# Patient Record
Sex: Male | Born: 1965 | Race: White | Hispanic: No | Smoking: Former smoker
Health system: Southern US, Community
[De-identification: ages and names within clinical notes are randomized; demographics above are authoritative.]

## PROBLEM LIST (undated history)

## (undated) DIAGNOSIS — K0889 Other specified disorders of teeth and supporting structures: Secondary | ICD-10-CM

## (undated) DIAGNOSIS — I429 Cardiomyopathy, unspecified: Secondary | ICD-10-CM

## (undated) HISTORY — PX: OTHER SURGICAL HISTORY: SHX169

## (undated) HISTORY — DX: Cardiomyopathy, unspecified: I42.9

---

## 2003-08-02 ENCOUNTER — Emergency Department (HOSPITAL_COMMUNITY): Admission: EM | Admit: 2003-08-02 | Discharge: 2003-08-02 | Payer: Self-pay | Admitting: Emergency Medicine

## 2015-02-06 ENCOUNTER — Emergency Department (HOSPITAL_COMMUNITY)
Admission: EM | Admit: 2015-02-06 | Discharge: 2015-02-06 | Disposition: A | Discharge status: Home / Self Care | Payer: Commercial Managed Care - HMO | Attending: Emergency Medicine | Admitting: Emergency Medicine

## 2015-02-06 ENCOUNTER — Encounter (HOSPITAL_COMMUNITY): Payer: Self-pay | Admitting: Emergency Medicine

## 2015-02-06 DIAGNOSIS — Z720 Tobacco use: Secondary | ICD-10-CM | POA: Diagnosis not present

## 2015-02-06 DIAGNOSIS — K047 Periapical abscess without sinus: Secondary | ICD-10-CM

## 2015-02-06 DIAGNOSIS — K029 Dental caries, unspecified: Secondary | ICD-10-CM | POA: Insufficient documentation

## 2015-02-06 DIAGNOSIS — K088 Other specified disorders of teeth and supporting structures: Secondary | ICD-10-CM | POA: Diagnosis present

## 2015-02-06 MED ORDER — TRAMADOL HCL 50 MG PO TABS: 50.0000 mg | ORAL_TABLET | Freq: Four times a day (QID) | ORAL | Status: DC | PRN

## 2015-02-06 MED ORDER — PENICILLIN V POTASSIUM 500 MG PO TABS: 500.0000 mg | ORAL_TABLET | Freq: Three times a day (TID) | ORAL | Status: DC

## 2015-02-06 NOTE — ED Provider Notes (Signed)
CSN: [id number]     Arrival date & time 02/06/15  1130 History  This chart was scribed for non-physician practitioner, Arthor Captain, PA-C, working with Linwood Dibbles, MD by Charline Bills, ED Scribe. This patient was seen in room TR07C/TR07C and the patient's care was started at 11:43 AM.   Chief Complaint  Patient presents with  . Dental Pain   The history is provided by the patient. No language interpreter was used.   HPI Comments: Aaron Avery is a 49 y.o. male who presents to the Emergency Department complaining of intermittent right dental pain for the past month. Pt suspects formation of a dental abscess to the area. He reports that dental pain radiates into his right jaw and is exacerbated with palpation. Pt has tried 8 Aleve tablets and 5 BC Powders yesterday without significant relief. He denies fever and difficulty swallowing. Pt is a smoker. No known medical allergies.   History reviewed. No pertinent past medical history. History reviewed. No pertinent past surgical history. No family history on file. Social History  Substance Use Topics  . Smoking status: Current Every Day Smoker -- 1.00 packs/day    Types: Cigarettes  . Smokeless tobacco: None  . Alcohol Use: No    Review of Systems  Constitutional: Negative for fever.  HENT: Positive for dental problem. Negative for trouble swallowing.    Allergies  Review of patient's allergies indicates no known allergies.  Home Medications   Prior to Admission medications   Not on File   BP 133/83 mmHg  Pulse 107  Temp(Src) 98.5 F (36.9 C)  Resp 20  SpO2 100% Physical Exam  Constitutional: He is oriented to person, place, and time. He appears well-developed and well-nourished. No distress.  HENT:  Head: Normocephalic and atraumatic.  Multiple dental carries. Fluctuance in R upper gum and an area that expresses some purulence.   Eyes: Conjunctivae and EOM are normal.  Neck: Neck supple. No tracheal deviation present.   Cardiovascular: Normal rate.   Pulmonary/Chest: Effort normal. No respiratory distress.  Musculoskeletal: Normal range of motion.  Neurological: He is alert and oriented to person, place, and time.  Skin: Skin is warm and dry.  Psychiatric: He has a normal mood and affect. His behavior is normal.  Nursing note and vitals reviewed.  ED Course  Procedures (including critical care time) DIAGNOSTIC STUDIES: Oxygen Saturation is 100% on RA, normal by my interpretation.    COORDINATION OF CARE: 11:47 AM-Discussed treatment plan which includes Veetid, Tramadol and follow-up with dentist with pt at bedside and pt agreed to plan.   Labs Review Labs Reviewed - No data to display  Imaging Review No results found. I, Arthor Captain, PA-C, have personally reviewed and evaluated these images and lab results as part of my medical decision-making.   EKG Interpretation None      MDM   Final diagnoses:  Dental infection    Patient with toothache and draining periapical abscess.  Exam unconcerning for Ludwig's angina or spread of infection.  Will treat with penicillin and pain medicine.  Urged patient to follow-up with dentist.     I personally performed the services described in this documentation, which was scribed in my presence. The recorded information has been reviewed and is accurate.       Arthor Captain, PA-C 02/06/15 1705  Linwood Dibbles, MD 02/07/15 [phone]

## 2015-02-06 NOTE — Discharge Instructions (Signed)
Dental Abscess A dental abscess is a collection of infected fluid (pus) from a bacterial infection in the inner part of the tooth (pulp). It usually occurs at the end of the tooth's root.  CAUSES   Severe tooth decay.  Trauma to the tooth that allows bacteria to enter into the pulp, such as a broken or chipped tooth. SYMPTOMS   Severe pain in and around the infected tooth.  Swelling and redness around the abscessed tooth or in the mouth or face.  Tenderness.  Pus drainage.  Bad breath.  Bitter taste in the mouth.  Difficulty swallowing.  Difficulty opening the mouth.  Nausea.  Vomiting.  Chills.  Swollen neck glands. DIAGNOSIS   A medical and dental history will be taken.  An examination will be performed by tapping on the abscessed tooth.  X-rays may be taken of the tooth to identify the abscess. TREATMENT The goal of treatment is to eliminate the infection. You may be prescribed antibiotic medicine to stop the infection from spreading. A root canal may be performed to save the tooth. If the tooth cannot be saved, it may be pulled (extracted) and the abscess may be drained.  HOME CARE INSTRUCTIONS  Only take over-the-counter or prescription medicines for pain, fever, or discomfort as directed by your caregiver.  Rinse your mouth (gargle) often with salt water ( tsp salt in 8 oz [250 ml] of warm water) to relieve pain or swelling.  Do not drive after taking pain medicine (narcotics).  Do not apply heat to the outside of your face.  Return to your dentist for further treatment as directed. SEEK MEDICAL CARE IF:  Your pain is not helped by medicine.  Your pain is getting worse instead of better. SEEK IMMEDIATE MEDICAL CARE IF:  You have a fever or persistent symptoms for more than 2-3 days.  You have a fever and your symptoms suddenly get worse.  You have chills or a very bad headache.  You have problems breathing or swallowing.  You have trouble  opening your mouth.  You have swelling in the neck or around the eye. Document Released: 06/09/2005 Document Revised: 03/03/2012 Document Reviewed: 09/17/2010 [facility] Patient Information 2015 [place], [place]. This information is not intended to replace advice given to you by your health care provider. Make sure you discuss any questions you have with your health care provider.   [facility] [address] [place], [place] [address] Phone [phone]  [facility] [employer] [facility] [place] exemplifies the [employer] vision to improve the health and quality of life of all [place]ians by Public house manager with a passion to care for the underserved and by leading the nation in community-based, service learning oral health education.  We are committed to offering comprehensive general dental services for adults, children and special needs patients in a safe, caring and professional setting.   Appointments: Our clinic is open Monday through Friday 8:00 a.m. until 5:00 p.m. The amount of time scheduled for an appointment depends on the patients specific needs. We ask that you keep your appointed time for care or provide 24-hour notice of all appointment changes. Parents or legal guardians must accompany minor children.   Payment for Services: Medicaid and other insurance plans are welcome. Payment for services is due when services are rendered and may be made by cash or credit card. If you have dental  insurance, we will assist you with your claim submission.    Emergencies:  Emergency services will be provided Monday through Friday on a walk-in basis.  Please arrive early for emergency services. After hours emergency services will be provided for patients of record as required.   Services:  Conservation officer, historic buildings Dentistry Oral Surgery - Extractions Root Canals Sealants and Tooth Colored Fillings Crowns and Bridges Dentures and Partial Dentures Implant Services Periodontal Services and Cleanings Cosmetic Armed forces operational officer [employer]

## 2015-02-06 NOTE — ED Notes (Signed)
Pt reports chronic dental issues but feels like he has an abcess in the top right of his mouth. Pt has taken multiple b/c powders and aleve yesterday with no relief.

## 2015-02-28 ENCOUNTER — Encounter (HOSPITAL_COMMUNITY): Payer: Self-pay | Admitting: *Deleted

## 2015-02-28 ENCOUNTER — Emergency Department (HOSPITAL_COMMUNITY)
Admission: EM | Admit: 2015-02-28 | Discharge: 2015-02-28 | Disposition: A | Discharge status: Home / Self Care | Payer: Commercial Managed Care - HMO | Attending: Emergency Medicine | Admitting: Emergency Medicine

## 2015-02-28 DIAGNOSIS — K047 Periapical abscess without sinus: Secondary | ICD-10-CM

## 2015-02-28 DIAGNOSIS — Z720 Tobacco use: Secondary | ICD-10-CM | POA: Insufficient documentation

## 2015-02-28 DIAGNOSIS — K088 Other specified disorders of teeth and supporting structures: Secondary | ICD-10-CM | POA: Diagnosis present

## 2015-02-28 DIAGNOSIS — Z792 Long term (current) use of antibiotics: Secondary | ICD-10-CM | POA: Insufficient documentation

## 2015-02-28 MED ORDER — NAPROXEN 500 MG PO TABS: 500.0000 mg | ORAL_TABLET | Freq: Two times a day (BID) | ORAL | Status: DC

## 2015-02-28 MED ORDER — AMOXICILLIN 500 MG PO CAPS: 500.0000 mg | ORAL_CAPSULE | Freq: Three times a day (TID) | ORAL | Status: DC

## 2015-02-28 NOTE — Discharge Instructions (Signed)
Follow up with a dentist as soon as possible.  Dental Abscess A dental abscess is a collection of infected fluid (pus) from a bacterial infection in the inner part of the tooth (pulp). It usually occurs at the end of the tooth's root.  CAUSES   Severe tooth decay.  Trauma to the tooth that allows bacteria to enter into the pulp, such as a broken or chipped tooth. SYMPTOMS   Severe pain in and around the infected tooth.  Swelling and redness around the abscessed tooth or in the mouth or face.  Tenderness.  Pus drainage.  Bad breath.  Bitter taste in the mouth.  Difficulty swallowing.  Difficulty opening the mouth.  Nausea.  Vomiting.  Chills.  Swollen neck glands. DIAGNOSIS   A medical and dental history will be taken.  An examination will be performed by tapping on the abscessed tooth.  X-rays may be taken of the tooth to identify the abscess. TREATMENT The goal of treatment is to eliminate the infection. You may be prescribed antibiotic medicine to stop the infection from spreading. A root canal may be performed to save the tooth. If the tooth cannot be saved, it may be pulled (extracted) and the abscess may be drained.  HOME CARE INSTRUCTIONS  Only take over-the-counter or prescription medicines for pain, fever, or discomfort as directed by your caregiver.  Rinse your mouth (gargle) often with salt water ( tsp salt in 8 oz [250 ml] of warm water) to relieve pain or swelling.  Do not drive after taking pain medicine (narcotics).  Do not apply heat to the outside of your face.  Return to your dentist for further treatment as directed. SEEK MEDICAL CARE IF:  Your pain is not helped by medicine.  Your pain is getting worse instead of better. SEEK IMMEDIATE MEDICAL CARE IF:  You have a fever or persistent symptoms for more than 2-3 days.  You have a fever and your symptoms suddenly get worse.  You have chills or a very bad headache.  You have problems  breathing or swallowing.  You have trouble opening your mouth.  You have swelling in the neck or around the eye. Document Released: 06/09/2005 Document Revised: 03/03/2012 Document Reviewed: 09/17/2010 [facility] Patient Information 2015 [place], [place]. This information is not intended to replace advice given to you by your health care provider. Make sure you discuss any questions you have with your health care provider.

## 2015-02-28 NOTE — ED Notes (Signed)
See Kerrie Buffalo NP note for secondary assessment.

## 2015-02-28 NOTE — ED Provider Notes (Signed)
CSN: [id number]     Arrival date & time 02/28/15  1420 History  This chart was scribed for Aaron Buffalo, NP working with Gerhard Munch, MD by Evon Slack, ED Scribe. This patient was seen in room TR06C/TR06C and the patient's care was started at 2:56 PM.     Chief Complaint  Patient presents with  . Dental Pain   Patient is a 49 y.o. male presenting with tooth pain.  Dental Pain Location:  Upper Severity:  Mild Onset quality:  Gradual Duration:  1 month Chronicity:  Recurrent Context: abscess   Associated symptoms: no difficulty swallowing and no fever    HPI Comments: Aaron Avery is a 49 y.o. male who presents to the Emergency Department complaining of recurrent right sided dental pain onset 1 month prior. Pt states that he he was seen in the ED last month for the same dental pain. Pt states that he has a dental abscess that has began draining. Pt states that he tried to pop the abscess this morning. Pt states that he was complaint with the antibiotics prescribed previously but did not follow up with a dentist. He doesn't report fever or trouble swallowing.   History reviewed. No pertinent past medical history. History reviewed. No pertinent past surgical history. No family history on file. Social History  Substance Use Topics  . Smoking status: Current Every Day Smoker -- 1.00 packs/day    Types: Cigarettes  . Smokeless tobacco: None  . Alcohol Use: No    Review of Systems  Constitutional: Negative for fever.  HENT: Positive for dental problem. Negative for trouble swallowing.   All other systems reviewed and are negative.     Allergies  Review of patient's allergies indicates no known allergies.  Home Medications   Prior to Admission medications   Medication Sig Start Date End Date Taking? Authorizing Provider  amoxicillin (AMOXIL) 500 MG capsule Take 1 capsule (500 mg total) by mouth 3 (three) times daily. 02/28/15   Hope Orlene Och, NP  naproxen (NAPROSYN) 500  MG tablet Take 1 tablet (500 mg total) by mouth 2 (two) times daily. 02/28/15   Hope Orlene Och, NP  penicillin v potassium (VEETID) 500 MG tablet Take 1 tablet (500 mg total) by mouth 3 (three) times daily. 02/06/15   Arthor Captain, PA-C  traMADol (ULTRAM) 50 MG tablet Take 1 tablet (50 mg total) by mouth every 6 (six) hours as needed. 02/06/15   Abigail Harris, PA-C   BP 117/73 mmHg  Pulse 70  Temp(Src) 98.8 F (37.1 C) (Oral)  Resp 18  Ht  (1.676 m)  Wt 155 lb (70.308 kg)  BMI 25.03 kg/m2  SpO2 98%   Physical Exam  Constitutional: He is oriented to person, place, and time. He appears well-developed and well-nourished. No distress.  HENT:  Head: Normocephalic and atraumatic.  Multiple dental caries, abscess above the right lateral incisor that's draining purulent drainage.   Eyes: Conjunctivae and EOM are normal.  Neck: Normal range of motion. Neck supple. No tracheal deviation present.  Cardiovascular: Normal rate.   Pulmonary/Chest: Effort normal. No respiratory distress.  Musculoskeletal: Normal range of motion.  Lymphadenopathy:    He has cervical adenopathy.  Neurological: He is alert and oriented to person, place, and time.  Skin: Skin is warm and dry.  Psychiatric: He has a normal mood and affect. His behavior is normal.  Nursing note and vitals reviewed.   ED Course  Procedures (including critical care time) DIAGNOSTIC STUDIES: Oxygen  Saturation is 98% on RA, normal by my interpretation.    COORDINATION OF CARE: 2:58 PM-Discussed treatment plan with pt at bedside and pt agreed to plan.      MDM  49 y.o. male with dental pain due to abscess. Stable for d/c without fever or difficulty swallowing. I discussed with the patient that this time he will need to follow up with the dentist as he was instructed at the last visit. Patient agrees to follow up. Will treat for infection and pain.   Final diagnoses:  Dental abscess   I personally performed the services  described in this documentation, which was scribed in my presence. The recorded information has been reviewed and is accurate.      [place], NP 02/28/15 2222  Tilden Fossa, MD 03/01/15 [phone]

## 2015-02-28 NOTE — ED Notes (Signed)
Pt reports continued pain to rt upper tooth. Pt was seen on the 16th. Pt was unable to follow up with the dentist. Pt completed antibiotics.

## 2015-03-30 ENCOUNTER — Encounter (HOSPITAL_COMMUNITY): Payer: Self-pay | Admitting: Emergency Medicine

## 2015-03-30 ENCOUNTER — Emergency Department (HOSPITAL_COMMUNITY)
Admission: EM | Admit: 2015-03-30 | Discharge: 2015-03-30 | Disposition: A | Discharge status: Home / Self Care | Payer: Commercial Managed Care - HMO | Attending: Emergency Medicine | Admitting: Emergency Medicine

## 2015-03-30 DIAGNOSIS — K0889 Other specified disorders of teeth and supporting structures: Secondary | ICD-10-CM | POA: Insufficient documentation

## 2015-03-30 DIAGNOSIS — Z792 Long term (current) use of antibiotics: Secondary | ICD-10-CM | POA: Insufficient documentation

## 2015-03-30 DIAGNOSIS — Z720 Tobacco use: Secondary | ICD-10-CM | POA: Diagnosis not present

## 2015-03-30 DIAGNOSIS — K029 Dental caries, unspecified: Secondary | ICD-10-CM | POA: Diagnosis not present

## 2015-03-30 MED ORDER — TRAMADOL HCL 50 MG PO TABS: 50.0000 mg | ORAL_TABLET | Freq: Four times a day (QID) | ORAL | Status: DC | PRN

## 2015-03-30 MED ORDER — CLINDAMYCIN HCL 150 MG PO CAPS
450.0000 mg | ORAL_CAPSULE | Freq: Once | ORAL | Status: AC
  Administered 2015-03-30: 450 mg via ORAL
  Filled 2015-03-30: qty 3

## 2015-03-30 MED ORDER — NAPROXEN 250 MG PO TABS
500.0000 mg | ORAL_TABLET | Freq: Once | ORAL | Status: AC
  Administered 2015-03-30: 500 mg via ORAL
  Filled 2015-03-30: qty 2

## 2015-03-30 MED ORDER — NAPROXEN 500 MG PO TABS: 500.0000 mg | ORAL_TABLET | Freq: Two times a day (BID) | ORAL | Status: DC

## 2015-03-30 MED ORDER — CLINDAMYCIN HCL 150 MG PO CAPS: 450.0000 mg | ORAL_CAPSULE | Freq: Three times a day (TID) | ORAL | Status: AC

## 2015-03-30 NOTE — Discharge Instructions (Signed)
Dental Caries Dental caries is tooth decay. This decay can cause a hole in teeth (cavity) that can get bigger and deeper over time. HOME CARE  Brush and floss your teeth. Do this at least two times a day.  Use a fluoride toothpaste.  Use a mouth rinse if told by your dentist or doctor.  Eat less sugary and starchy foods. Drink less sugary drinks.  Avoid snacking often on sugary and starchy foods. Avoid sipping often on sugary drinks.  Keep regular checkups and cleanings with your dentist.  Use fluoride supplements if told by your dentist or doctor.  Allow fluoride to be applied to teeth if told by your dentist or doctor.   This information is not intended to replace advice given to you by your health care provider. Make sure you discuss any questions you have with your health care provider.   Document Released: 03/18/2008 Document Revised: 06/30/2014 Document Reviewed: 06/11/2012 Elsevier Interactive Patient Education 2016 [employer].    Emergency Department Resource Guide 1) Find a Doctor and Pay Out of Pocket Although you won't have to find out who is covered by your insurance plan, it is a good idea to ask around and get recommendations. You will then need to call the office and see if the doctor you have chosen will accept you as a new patient and what types of options they offer for patients who are self-pay. Some doctors offer discounts or will set up payment plans for their patients who do not have insurance, but you will need to ask so you aren't surprised when you get to your appointment.  2) Contact Your Local Health Department Not all health departments have doctors that can see patients for sick visits, but many do, so it is worth a call to see if yours does. If you don't know where your local health department is, you can check in your phone book. The CDC also has a tool to help you locate your state's health department, and many state websites also have listings of  all of their local health departments.  3) Find a Walk-in Clinic If your illness is not likely to be very severe or complicated, you may want to try a walk in clinic. These are popping up all over the country in pharmacies, drugstores, and shopping centers. They're usually staffed by nurse practitioners or physician assistants that have been trained to treat common illnesses and complaints. They're usually fairly quick and inexpensive. However, if you have serious medical issues or chronic medical problems, these are probably not your best option.  No Primary Care Doctor: - Call Health Connect at  [phone] - they can help you locate a primary care doctor that  accepts your insurance, provides certain services, etc. - Physician Referral Service- [phone]  Chronic Pain Problems: Organization         Address  Phone   Notes  [facility]  [phone] Patients need to be referred by their primary care doctor.   Medication Assistance: Organization         Address  Phone   Notes  [facility] [address] [place] [place], [place] [address] [phone] --Must be a resident of [facility] -- Must have NO insurance coverage whatsoever (no Medicaid/ Medicare, etc.) -- The pt. MUST have a primary care doctor that directs their care regularly and follows them in the community   [facility]ist  [phone]   [address]  (  Agencies that provide inexpensive medical care: °Organization         Address  Phone   Notes  °[facility]  ([phone]) [phone]   °[facility]    ([phone]) [phone]   °[facility] [address] °[place], [place] [address] ([phone]) [phone]   °[facility] [address], °[place] ([phone]) [phone]   °[facility]    (336) 373-0678   °[facility]    (336) 272-1050   °[facility] ° [address],  [place] Phone:  (336) [phone], Fax:  ([phone]) [phone] Hours of Operation:  9 am - 6 pm, M-F.  Also accepts Medicaid/Medicare and self-pay.  °[facility] ° [address], [place] Phone: (336) [phone], Fax: ([phone]) [phone]. Hours of Operation:  8:30 am - 5:30 pm, M-F.  Also accepts Medicaid and self-pay.  °[facility] [place] [address], [place] Phone: ([phone]) [phone]   °[facility] [address], [place], [place] (336)[phone], Ext. 123 Mondays & Thursdays: 7-9 AM.  First 15 patients are seen on a first come, first serve basis. °  ° °Medicaid-accepting [place] Providers: ° °Organization         Address  Phone   Notes  °[facility] [address], [place] ([phone]) [phone] Also accepts self-pay patients.  °[facility] [address], [place] ° (336) [phone]   °[facility] [address], [place] ([phone]) [phone]   °[facility] [address] [place] [address], [place] ([phone]) [phone]   °Veita Bland [address], [place]  ° ([phone]) [phone] Only accepts [place] Access Medicaid patients after they have their name applied to their card.  ° °Self-Pay (no insurance) in [place]: ° °Organization         Address  Phone   Notes  °Sickle Cell Patients, [facility] [address], [place] ([phone]) [phone]   °[facility] [address], [place] ([phone]) [phone]   ° [facility] ° [address], [place] (336) [phone]   °[facility]/Dr. Osei-Bonsu ° [address] [place] [address], [place] or [address], [place] ([phone]) [phone] Phone number for both [place] and [place] locations is the same.  °[facility] [address], [place] ([phone]) [phone]   °[facility] [address] [place] [address], [place] or [address] ([phone]) [phone] °(336) [phone]   °[facility] [address], [place] (336) 350-1642, phone; ([phone]) [phone], fax Sees patients 1st and 3rd Saturday of every month.  Must not qualify for public or private insurance (i.e. Medicaid, Medicare, [place] Health Choice, Veterans' Benefits) • Household income should be no more than 200% of the poverty level •The clinic cannot treat you if you are pregnant or think you are pregnant • Sexually transmitted diseases are not treated at the clinic.  ° ° °Dental Care: °Organization         Address  Phone  Notes  °[place] [facility] [address], [place] (336) [phone] Accepts children up to age 21 who are enrolled in Medicaid or [place] Health Choice; pregnant women with a Medicaid card; and children who have applied for Medicaid or   have applied for Medicaid or [place] Health Choice, but were declined, whose parents can pay a reduced fee at time of service.  [facility]  [address], [place] [phone] Accepts children up to age 57 who are enrolled in [place] or [place] Health Choice; pregnant women with a Medicaid card; and children who have applied for Medicaid or [place] Health Choice, but were declined, whose parents can pay a reduced fee at time of service.  [facility]  [address] [place], [place] [phone] Patients are seen by appointment only. Walk-ins are not accepted. [facility] will see patients 54 years of age and older. Monday - Tuesday (8am-5pm) Most Wednesdays (8:30-5pm) $30 per visit, cash only  [facility]  [address], [facility] [phone] Patients are seen by appointment only. Walk-ins are not accepted. [facility] will see patients 58 years of age and older. One Wednesday Evening (Monthly: Volunteer Based).  $30 per visit, cash only  [employer]   [phone] for adults; Children under age 72, call Graduate Pediatric Dentistry at [phone]. Children aged 19-14, please call [phone] to request a pediatric application.  Dental services are provided in all areas of dental care including fillings, crowns and bridges, complete and partial dentures, implants, gum treatment, root canals, and extractions. Preventive care is also provided. Treatment is provided to both adults and children. Patients are selected via a lottery and there is often a waiting list.   [facility] [address], [place]  [phone] [email or web address]   [facility] [address] [place], [place] [phone]) [phone], Ext. [phone] Second and Fourth Thursday of each month, opens at 6:30 AM; Clinic ends at 9 AM.  Patients are seen on a first-come first-served basis, and a limited number are seen during each clinic.   [facility]  [address] [place], [place] [phone]   Eligibility Requirements You must have lived in [place], [place], or [place] counties for at least the last three months.   You cannot be eligible for state or federal sponsored [place], including [employer], [place], or [employer].   You generally cannot be eligible for healthcare insurance through your employer.    How to apply: Eligibility screenings are held every Tuesday and Wednesday afternoon from 1:00 pm until 4:00 pm. You do not need an appointment for the interview!  [facility] [address] [place], [place] [phone]   [facility] Health Department  [phone]   [facility] Health Department  [phone]   [facility] Health Department  [phone]    Behavioral Health Resources in the Community: Intensive Outpatient Programs Organization         Address  Phone  Notes  [facility] [address] [place], [place] [phone]   [facility] [address], [place], [place] [phone]   [facility] [address], [place], [place]  [phone]   [facility] [address],  [place], [place] [phone] or 8640714858   Substance Abuse Resources Organization         Address  Phone  Notes  [facility]  [phone]   [facility]  [phone]   [facility]  [phone]   [facility]  9178133752   Residential & Outpatient  Substance Abuse Program  [phone]   Psychological Services Organization         Address  Phone  Notes  [facility] Health  [phone]   [facility] Services  [phone]   [facility] Mental Health [phone] [address], [place] [phone] or [phone]    Mobile Crisis Teams Organization         Address  Phone  Notes  [facility], Mobile Crisis Care Unit  [phone]   [facility]  [address] [place], [place] [phone]   Doristine Locks [address] [place] [phone]    Self-Help/Support Groups Organization         Address  Phone             Notes  [facility] [place] - variety of support groups  336- I7437963 Call for more information  Narcotics Anonymous (NA), [facility] [address], [employer] [place]  2 meetings at this location   Statistician         Address  Phone  Notes  [facility] [address],    [place]  [phone]   [facility]  [address], [place] [address], [place], [place] [phone]   [facility] [address] [place], [place] [phone] Admissions: 8am-3pm M-F  [facility] [address],    [place], [place] [phone]   [facility] [address] [place], [place], [place] [phone]   [facility] [address],  [place], [place] [phone]     [facility] - Intensive Outpatient [address], [place], [place] [phone]   [facility] ([facility] [address] [place]  [place], [place] [phone] or [phone]   [facility] ([facility]) [address], [place], [place] [phone] Accepts Medicaid  [facility] [address],  [place] [phone] Substance Abuse/Addiction Treatment   [facility] Organization         Address  Phone  Notes  [facility]  [phone]   Angie Fava, PhD [address] [place], [place]   3183334004 or ([phone]   [facility]   [address] [place], [place] [phone]   [facility] [address] [place], [place] [phone] Insurance/Medicaid/sponsorship through [facility] Families [address]                                    [place], [place] [phone] Therapy/tele-psych/case  [facility] [address], [place] [phone]    Dr. Lolly Mustache  [phone]   [facility]  [employer] [facility] Dept. 1) [address] [place] 2) [address] [place] 3)  [address], [place] [phone]  [phone]   [facility] Child Abuse Hotline 860-202-4044 or 6803880810 (After Hours)

## 2015-03-30 NOTE — ED Notes (Signed)
Pt here today for right upper dental pain. States he was here a few weeks ago for same. Took abx but 'it came back immediately'.  States he was unable to get into a dentist.

## 2015-03-30 NOTE — ED Provider Notes (Signed)
CSN: [id number]     Arrival date & time 03/30/15  1010 History  By signing my name below, I, Aaron Avery, attest that this documentation has been prepared under the direction and in the presence of Aaron Berry, PA-C. Electronically Signed: Elon Avery ED Scribe. 03/30/2015. 11:09 AM.    Chief Complaint  Patient presents with  . Dental Pain   The history is provided by the patient. No language interpreter was used.   HPI Comments: Aaron Avery is a 49 y.o. male who presents to the Emergency Department complaining of dental pain onset two months ago; partially relieved by [facility] powder, Aleve, and Melatonin (Last medication: 3:00 am).  His pain is located upper right posterior molars.  He has a history of occasional drainage from the gumline above his tooth.  He claims he can express it by pushing on the roof of his mouth or near his tooth.  He has been seen in the ED twice before and prescribed amoxicillin with compliance and brief improvement, however, he did not comply with dental f/u.  He denies fever, chills, sweats, N, V, difficulty breathing or swallowing.  He does not have any neck pain, sore throat, ear ache or head ache.   History reviewed. No pertinent past medical history. History reviewed. No pertinent past surgical history. No family history on file. Social History  Substance Use Topics  . Smoking status: Current Every Day Smoker -- 1.00 packs/day    Types: Cigarettes  . Smokeless tobacco: None  . Alcohol Use: No    Review of Systems  Constitutional: Negative.  Negative for fever, chills, diaphoresis and fatigue.  HENT: Negative for congestion, drooling, ear pain, facial swelling, mouth sores, sore throat, tinnitus, trouble swallowing and voice change.   Eyes: Negative.   Respiratory: Negative.  Negative for choking, shortness of breath and stridor.   Cardiovascular: Negative.   Gastrointestinal: Negative.   Genitourinary: Negative.   Musculoskeletal: Negative.    Neurological: Negative.   Psychiatric/Behavioral: Negative.       Allergies  Review of patient's allergies indicates no known allergies.  Home Medications   Prior to Admission medications   Medication Sig Start Date End Date Taking? Authorizing Provider  amoxicillin (AMOXIL) 500 MG capsule Take 1 capsule (500 mg total) by mouth 3 (three) times daily. 02/28/15   Hope Orlene Och, NP  naproxen (NAPROSYN) 500 MG tablet Take 1 tablet (500 mg total) by mouth 2 (two) times daily. 02/28/15   Hope Orlene Och, NP  penicillin v potassium (VEETID) 500 MG tablet Take 1 tablet (500 mg total) by mouth 3 (three) times daily. 02/06/15   Arthor Captain, PA-C  traMADol (ULTRAM) 50 MG tablet Take 1 tablet (50 mg total) by mouth every 6 (six) hours as needed. 02/06/15   Abigail Harris, PA-C   BP 135/79 mmHg  Pulse 84  Temp(Src) 98.4 F (36.9 C) (Oral)  Resp 18  Ht 5\' 7"  (1.702 m)  Wt 165 lb (74.844 kg)  BMI 25.84 kg/m2  SpO2 100% Physical Exam  Constitutional: He is oriented to person, place, and time. Vital signs are normal. He appears well-developed and well-nourished. He is cooperative.  Non-toxic appearance. No distress.  HENT:  Head: Normocephalic and atraumatic. Head is without right periorbital erythema and without left periorbital erythema.  Right Ear: Hearing and external ear normal.  Left Ear: Hearing and external ear normal.  Nose: Nose normal. No mucosal edema, rhinorrhea or sinus tenderness. Right sinus exhibits no maxillary sinus tenderness and no  frontal sinus tenderness. Left sinus exhibits no maxillary sinus tenderness and no frontal sinus tenderness.  Mouth/Throat: Uvula is midline, oropharynx is clear and moist and mucous membranes are normal. Mucous membranes are not pale, not dry and not cyanotic. No oral lesions. No trismus in the jaw. Abnormal dentition. Dental caries present. No dental abscesses, uvula swelling or lacerations. No oropharyngeal exudate, posterior oropharyngeal edema,  posterior oropharyngeal erythema or tonsillar abscesses.  Diffuse dental decay, multiple missing teeth, no palpated abscess, no evidence of draining periapical abscess.  No fluctuance or tenderness to hard palate. No buccal or gingival mucosal edema, erythema, or fluctuance.  No sublingual ttp  Eyes: Conjunctivae, EOM and lids are normal. Pupils are equal, round, and reactive to light. Right eye exhibits no chemosis and no discharge. Left eye exhibits no chemosis and no discharge. No scleral icterus.  Neck: Trachea normal, normal range of motion, full passive range of motion without pain and phonation normal. Neck supple. No JVD present. No tracheal tenderness present. No rigidity. No tracheal deviation, no edema, no erythema and normal range of motion present. No thyroid mass present.  Cardiovascular: Normal rate and regular rhythm.  Exam reveals no gallop and no friction rub.   No murmur heard. Pulmonary/Chest: Effort normal and breath sounds normal. No accessory muscle usage or stridor. No tachypnea. No respiratory distress. He has no decreased breath sounds. He has no wheezes. He has no rhonchi. He has no rales. Chest wall is not dull to percussion. He exhibits no tenderness, no deformity and no retraction.  Musculoskeletal: Normal range of motion. He exhibits no edema.  Lymphadenopathy:       Head (right side): No submental, no submandibular and no tonsillar adenopathy present.       Head (left side): No submental, no submandibular and no tonsillar adenopathy present.    He has no cervical adenopathy.  Neurological: He is alert and oriented to person, place, and time. He exhibits normal muscle tone. Coordination normal.  Skin: Skin is warm and dry. No rash noted. He is not diaphoretic. No cyanosis or erythema. No pallor. Nails show no clubbing.  Psychiatric: He has a normal mood and affect. His speech is normal and behavior is normal. Judgment and thought content normal. Cognition and memory are  normal.  Nursing note and vitals reviewed. Periodontal Exam    ED Course  Procedures (including critical care time)  DIAGNOSTIC STUDIES: Oxygen Saturation is 100% on RA, normal by my interpretation.    COORDINATION OF CARE:  11:09 AM Will prescribe anti-biotic and anti-inflammatory.  Patient should f/u with dentist.  Patient acknowledges and agrees with plan.     Labs Review Labs Reviewed - No data to display  Imaging Review No results found. I have personally reviewed and evaluated these images and lab results as part of my medical decision-making.   EKG Interpretation None      MDM   Final diagnoses:  None    Patient with toothache.  Vast dental decay.  No gross abscess.  Exam unconcerning for Ludwig's angina or spread of infection.  Will treat with clinda, however I explained to the patient that a brief coarse of antibiotics will not fix decades of dental decay and he needs to be persistent in establishing dental care.  He was given the resorse guide and dental contacts at his previous ER visits.  He has called before to set up an appointment, but states he was unable to make it because of work.   He  stated he will try again to get in with the dentist.  Return precautions were given.  Pt D/C home in satisfactory condition with improvement of pain.  Medications  clindamycin (CLEOCIN) capsule 450 mg (450 mg Oral Given 03/30/15 1145)  naproxen (NAPROSYN) tablet 500 mg (500 mg Oral Given 03/30/15 1146)   Filed Vitals:   03/30/15 1015 03/30/15 1136  BP: 135/79   Pulse: 84 65  Temp: 98.4 F (36.9 C) 98.1 F (36.7 C)  TempSrc: Oral Oral  Resp: 18 18  Height:  (1.702 m)   Weight: 165 lb (74.844 kg)   SpO2: 100% 99%   I personally performed the services described in this documentation, which was scribed in my presence. The recorded information has been reviewed and is accurate.   Aaron Berry, PA-C 04/02/15 1610  Jerelyn Scott, MD 04/03/15 [phone]

## 2015-03-30 NOTE — ED Notes (Signed)
Pt c/o right upper toothache onset last night. Pt seen here for same in past and given medication that has helped. Pt unable to see a dentist.

## 2016-12-17 ENCOUNTER — Emergency Department (HOSPITAL_COMMUNITY)
Admission: EM | Admit: 2016-12-17 | Discharge: 2016-12-17 | Disposition: A | Discharge status: Home / Self Care | Payer: Commercial Managed Care - HMO | Attending: Emergency Medicine | Admitting: Emergency Medicine

## 2016-12-17 DIAGNOSIS — Z79899 Other long term (current) drug therapy: Secondary | ICD-10-CM | POA: Insufficient documentation

## 2016-12-17 DIAGNOSIS — L723 Sebaceous cyst: Secondary | ICD-10-CM | POA: Insufficient documentation

## 2016-12-17 DIAGNOSIS — F17210 Nicotine dependence, cigarettes, uncomplicated: Secondary | ICD-10-CM | POA: Insufficient documentation

## 2016-12-17 DIAGNOSIS — L0889 Other specified local infections of the skin and subcutaneous tissue: Secondary | ICD-10-CM | POA: Insufficient documentation

## 2016-12-17 DIAGNOSIS — L089 Local infection of the skin and subcutaneous tissue, unspecified: Secondary | ICD-10-CM

## 2016-12-17 MED ORDER — LIDOCAINE HCL (PF) 1 % IJ SOLN
5.0000 mL | Freq: Once | INTRAMUSCULAR | Status: AC
Start: 2016-12-17 — End: 2016-12-17
  Administered 2016-12-17: 5 mL
  Filled 2016-12-17: qty 5

## 2016-12-17 MED ORDER — TETANUS-DIPHTH-ACELL PERTUSSIS 5-2.5-18.5 LF-MCG/0.5 IM SUSP
0.5000 mL | Freq: Once | INTRAMUSCULAR | Status: AC
  Administered 2016-12-17: 0.5 mL via INTRAMUSCULAR
  Filled 2016-12-17: qty 0.5

## 2016-12-17 MED ORDER — CEPHALEXIN 500 MG PO CAPS: 500.0000 mg | ORAL_CAPSULE | Freq: Four times a day (QID) | ORAL | 0 refills | Status: DC

## 2016-12-17 NOTE — Discharge Instructions (Signed)
Take tylenol and/or ibuprofen for discomfort.

## 2016-12-17 NOTE — ED Provider Notes (Signed)
[facility]-EMERGENCY DEPT Provider Note   CSN: [id number] Arrival date & time: 12/17/16  1335  By signing my name below, I, Phillips Climes, attest that this documentation has been prepared under the direction and in the presence of Kerrie Buffalo, NP. Electronically Signed: Phillips Climes, Scribe. 12/18/2016. 2:21 AM.  History   Chief Complaint Chief Complaint  Patient presents with  . Mass   HPI Comments ELAN BRAINERD is a 51 y.o. male with a PMHx significant for dental abscess, who presents to the Emergency Department with complaints of a gradually worsening right jaw cyst x9 months.  Pt believes it is an ingrown hair.  No gland swelling.  No drainage.  Pt denies experiencing any other acute sx, including fevers, chills, nausea, vomiting or abdominal pain. Patient states that he wants it opened up.  The history is provided by the patient and medical records. No language interpreter was used.    No past medical history on file.  There are no active problems to display for this patient.   No past surgical history on file.   Home Medications    Prior to Admission medications   Medication Sig Start Date End Date Taking? Authorizing Provider  amoxicillin (AMOXIL) 500 MG capsule Take 1 capsule (500 mg total) by mouth 3 (three) times daily. 02/28/15   Janne Napoleon, NP  cephALEXin (KEFLEX) 500 MG capsule Take 1 capsule (500 mg total) by mouth 4 (four) times daily. 12/17/16   Janne Napoleon, NP  naproxen (NAPROSYN) 500 MG tablet Take 1 tablet (500 mg total) by mouth 2 (two) times daily. 03/30/15   Danelle Berry, PA-C  penicillin v potassium (VEETID) 500 MG tablet Take 1 tablet (500 mg total) by mouth 3 (three) times daily. 02/06/15   Arthor Captain, PA-C  traMADol (ULTRAM) 50 MG tablet Take 1 tablet (50 mg total) by mouth every 6 (six) hours as needed for severe pain. 03/30/15   Danelle Berry, PA-C    Family History No family history on file.  Social History Social History  Substance Use  Topics  . Smoking status: Current Every Day Smoker    Packs/day: 1.00    Types: Cigarettes  . Smokeless tobacco: Not on file  . Alcohol use No     Allergies   Patient has no known allergies.   Review of Systems Review of Systems  Constitutional: Negative for chills and fever.  HENT: Positive for facial swelling. Negative for trouble swallowing.   Gastrointestinal: Negative for abdominal pain, nausea and vomiting.     Physical Exam Updated Vital Signs BP 119/81 (BP Location: Right Arm)   Pulse (!) 53   Temp 98.8 F (37.1 C) (Oral)   Resp 18   SpO2 96%   Physical Exam  Constitutional: He appears well-developed and well-nourished. No distress.  HENT:  Head: Normocephalic and atraumatic.    3cm raised firm area that is mobile.  Non-tender.  To the right jaw line.  No erythema.  Eyes: EOM are normal.  Neck: Normal range of motion. Neck supple.  No lymphadenopathy.  Cardiovascular: Normal rate.  Exam reveals no friction rub.   Pulmonary/Chest: Effort normal.  Neurological: He is alert.  Skin: Skin is warm and dry. Capillary refill takes less than 2 seconds.  Psychiatric: He has a normal mood and affect. His behavior is normal.  Nursing note and vitals reviewed.  ED Treatments / Results  DIAGNOSTIC STUDIES: Oxygen Saturation is 100% on room air, normal by my interpretation.  COORDINATION OF CARE: 4:10 PM Discussed treatment plan with pt at bedside and pt agreed to plan.  Labs (all labs ordered are listed, but only abnormal results are displayed) Labs Reviewed - No data to display  Radiology No results found.  Procedures .Marland KitchenIncision and Drainage Date/Time: 12/17/2016 5:13 PM Performed by: Janne Napoleon Authorized by: Janne Napoleon   Consent:    Consent obtained:  Verbal   Consent given by:  Patient   Risks discussed:  Incomplete drainage and pain   Alternatives discussed:  No treatment, referral and alternative treatment Location:    Type:  Cyst    Size:  3 cm   Location:  Head   Head location:  Face Pre-procedure details:    Skin preparation:  Betadine Anesthesia (see MAR for exact dosages):    Anesthesia method:  Local infiltration   Local anesthetic:  Lidocaine 1% w/o epi Procedure type:    Complexity:  Complex Procedure details:    Incision types:  Single straight   Incision depth:  Dermal   Scalpel blade:  11   Wound management:  Irrigated with saline, extensive cleaning and probed and deloculated   Drainage characteristics: White, cheesy.   Drainage amount:  Copious   Wound treatment:  Wound left open   Packing materials:  None Post-procedure details:    Patient tolerance of procedure:  Tolerated well, no immediate complications      (including critical care time)  Medications Ordered in ED Medications  lidocaine (PF) (XYLOCAINE) 1 % injection 5 mL (5 mLs Infiltration Given 12/17/16 1656)  Tdap (BOOSTRIX) injection 0.5 mL (0.5 mLs Intramuscular Given 12/17/16 1655)     Initial Impression / Assessment and Plan / ED Course  I have reviewed the triage vital signs and the nursing notes.  I discussed with the patient prior to I&D that this was not an abscess and that he may need to see a dermatologist if the area does not resolve. He voices understanding but request I&D while here.   Final Clinical Impressions(s) / ED Diagnoses  51 y.o. male with cyst to the right side of his face stable for d/c without fever, pain or other signs of infection. Discussed with the patient plan of care and all questioned fully answered. He will return if any problems arise.  Final diagnoses:  Infected sebaceous cyst of skin   I personally performed the services described in this documentation, which was scribed in my presence. The recorded information has been reviewed and is accurate.  New Prescriptions Discharge Medication List as of 12/17/2016  5:27 PM    START taking these medications   Details  cephALEXin (KEFLEX) 500 MG  capsule Take 1 capsule (500 mg total) by mouth 4 (four) times daily., Starting Wed 12/17/2016, Print         [place], [place], [place] 12/18/16 2119    Loren Racer, MD 12/19/16 [phone]

## 2016-12-17 NOTE — ED Triage Notes (Signed)
Pt has a mass on the right side of his jaw that he states has been there over 6 months, mass is not painful , no redness or signs of infection.

## 2019-05-10 ENCOUNTER — Inpatient Hospital Stay (HOSPITAL_COMMUNITY): Payer: Self-pay

## 2019-05-10 ENCOUNTER — Inpatient Hospital Stay (HOSPITAL_COMMUNITY)
Admission: EM | Admit: 2019-05-10 | Discharge: 2019-05-14 | DRG: 280 | Disposition: A | Discharge status: Home / Self Care | Payer: Self-pay | Attending: Cardiology | Admitting: Cardiology

## 2019-05-10 ENCOUNTER — Inpatient Hospital Stay (HOSPITAL_COMMUNITY)
Admission: EM | Disposition: A | Discharge status: Home / Self Care | Payer: Self-pay | Source: Home / Self Care | Attending: Cardiology

## 2019-05-10 ENCOUNTER — Emergency Department (HOSPITAL_COMMUNITY): Payer: Self-pay

## 2019-05-10 ENCOUNTER — Encounter (HOSPITAL_COMMUNITY): Payer: Self-pay | Admitting: Emergency Medicine

## 2019-05-10 DIAGNOSIS — I462 Cardiac arrest due to underlying cardiac condition: Secondary | ICD-10-CM | POA: Diagnosis present

## 2019-05-10 DIAGNOSIS — Z79899 Other long term (current) drug therapy: Secondary | ICD-10-CM

## 2019-05-10 DIAGNOSIS — I5021 Acute systolic (congestive) heart failure: Secondary | ICD-10-CM | POA: Clinically undetermined

## 2019-05-10 DIAGNOSIS — G92 Toxic encephalopathy: Secondary | ICD-10-CM | POA: Diagnosis present

## 2019-05-10 DIAGNOSIS — Z599 Problem related to housing and economic circumstances, unspecified: Secondary | ICD-10-CM

## 2019-05-10 DIAGNOSIS — R413 Other amnesia: Secondary | ICD-10-CM | POA: Diagnosis present

## 2019-05-10 DIAGNOSIS — R402342 Coma scale, best motor response, flexion withdrawal, at arrival to emergency department: Secondary | ICD-10-CM | POA: Diagnosis present

## 2019-05-10 DIAGNOSIS — K0889 Other specified disorders of teeth and supporting structures: Secondary | ICD-10-CM | POA: Diagnosis present

## 2019-05-10 DIAGNOSIS — I959 Hypotension, unspecified: Secondary | ICD-10-CM | POA: Diagnosis present

## 2019-05-10 DIAGNOSIS — I469 Cardiac arrest, cause unspecified: Secondary | ICD-10-CM

## 2019-05-10 DIAGNOSIS — I2119 ST elevation (STEMI) myocardial infarction involving other coronary artery of inferior wall: Principal | ICD-10-CM | POA: Diagnosis present

## 2019-05-10 DIAGNOSIS — R739 Hyperglycemia, unspecified: Secondary | ICD-10-CM | POA: Diagnosis present

## 2019-05-10 DIAGNOSIS — R402112 Coma scale, eyes open, never, at arrival to emergency department: Secondary | ICD-10-CM | POA: Diagnosis present

## 2019-05-10 DIAGNOSIS — F1290 Cannabis use, unspecified, uncomplicated: Secondary | ICD-10-CM | POA: Diagnosis present

## 2019-05-10 DIAGNOSIS — I428 Other cardiomyopathies: Secondary | ICD-10-CM | POA: Diagnosis present

## 2019-05-10 DIAGNOSIS — E440 Moderate protein-calorie malnutrition: Secondary | ICD-10-CM | POA: Insufficient documentation

## 2019-05-10 DIAGNOSIS — Z452 Encounter for adjustment and management of vascular access device: Secondary | ICD-10-CM

## 2019-05-10 DIAGNOSIS — I219 Acute myocardial infarction, unspecified: Secondary | ICD-10-CM

## 2019-05-10 DIAGNOSIS — R05 Cough: Secondary | ICD-10-CM

## 2019-05-10 DIAGNOSIS — G9340 Encephalopathy, unspecified: Secondary | ICD-10-CM

## 2019-05-10 DIAGNOSIS — J9601 Acute respiratory failure with hypoxia: Secondary | ICD-10-CM | POA: Diagnosis present

## 2019-05-10 DIAGNOSIS — J9600 Acute respiratory failure, unspecified whether with hypoxia or hypercapnia: Secondary | ICD-10-CM

## 2019-05-10 DIAGNOSIS — Z23 Encounter for immunization: Secondary | ICD-10-CM

## 2019-05-10 DIAGNOSIS — R402212 Coma scale, best verbal response, none, at arrival to emergency department: Secondary | ICD-10-CM | POA: Diagnosis present

## 2019-05-10 DIAGNOSIS — F17210 Nicotine dependence, cigarettes, uncomplicated: Secondary | ICD-10-CM | POA: Diagnosis present

## 2019-05-10 DIAGNOSIS — Z789 Other specified health status: Secondary | ICD-10-CM

## 2019-05-10 DIAGNOSIS — Z20828 Contact with and (suspected) exposure to other viral communicable diseases: Secondary | ICD-10-CM | POA: Diagnosis present

## 2019-05-10 DIAGNOSIS — R059 Cough, unspecified: Secondary | ICD-10-CM

## 2019-05-10 DIAGNOSIS — I213 ST elevation (STEMI) myocardial infarction of unspecified site: Secondary | ICD-10-CM

## 2019-05-10 HISTORY — PX: LEFT HEART CATH AND CORONARY ANGIOGRAPHY: CATH118249

## 2019-05-10 HISTORY — DX: Other specified disorders of teeth and supporting structures: K08.89

## 2019-05-10 HISTORY — DX: Acute myocardial infarction, unspecified: I21.9

## 2019-05-10 LAB — POCT I-STAT, CHEM 8
BUN: 6 mg/dL (ref 6–20)
BUN: 6 mg/dL (ref 6–20)
BUN: 6 mg/dL (ref 6–20)
Calcium, Ion: 1.07 mmol/L — ABNORMAL LOW (ref 1.15–1.40)
Calcium, Ion: 1.16 mmol/L (ref 1.15–1.40)
Calcium, Ion: 1.16 mmol/L (ref 1.15–1.40)
Chloride: 103 mmol/L (ref 98–111)
Chloride: 103 mmol/L (ref 98–111)
Chloride: 104 mmol/L (ref 98–111)
Creatinine, Ser: 1.4 mg/dL — ABNORMAL HIGH (ref 0.61–1.24)
Creatinine, Ser: 1.2 mg/dL (ref 0.61–1.24)
Creatinine, Ser: 1.3 mg/dL — ABNORMAL HIGH (ref 0.61–1.24)
Glucose, Bld: 170 mg/dL — ABNORMAL HIGH (ref 70–99)
Glucose, Bld: 170 mg/dL — ABNORMAL HIGH (ref 70–99)
Glucose, Bld: 178 mg/dL — ABNORMAL HIGH (ref 70–99)
HCT: 47 % (ref 39.0–52.0)
HCT: 42 % (ref 39.0–52.0)
HCT: 46 % (ref 39.0–52.0)
Hemoglobin: 16 g/dL (ref 13.0–17.0)
Hemoglobin: 14.3 g/dL (ref 13.0–17.0)
Hemoglobin: 15.6 g/dL (ref 13.0–17.0)
Potassium: 2.7 mmol/L — CL (ref 3.5–5.1)
Potassium: 3.8 mmol/L (ref 3.5–5.1)
Potassium: 3.9 mmol/L (ref 3.5–5.1)
Sodium: 140 mmol/L (ref 135–145)
Sodium: 139 mmol/L (ref 135–145)
Sodium: 140 mmol/L (ref 135–145)
TCO2: 22 mmol/L (ref 22–32)
TCO2: 24 mmol/L (ref 22–32)
TCO2: 24 mmol/L (ref 22–32)

## 2019-05-10 LAB — URINALYSIS, ROUTINE W REFLEX MICROSCOPIC
Bacteria, UA: NONE SEEN
Bilirubin Urine: NEGATIVE
Glucose, UA: NEGATIVE mg/dL
Ketones, ur: NEGATIVE mg/dL
Leukocytes,Ua: NEGATIVE
Nitrite: NEGATIVE
Protein, ur: NEGATIVE mg/dL
Specific Gravity, Urine: 1.045 — ABNORMAL HIGH (ref 1.005–1.030)
pH: 5 (ref 5.0–8.0)

## 2019-05-10 LAB — POCT I-STAT 7, (LYTES, BLD GAS, ICA,H+H)
Acid-base deficit: 5 mmol/L — ABNORMAL HIGH (ref 0.0–2.0)
Acid-base deficit: 4 mmol/L — ABNORMAL HIGH (ref 0.0–2.0)
Acid-base deficit: 3 mmol/L — ABNORMAL HIGH (ref 0.0–2.0)
Bicarbonate: 23.1 mmol/L (ref 20.0–28.0)
Bicarbonate: 22.5 mmol/L (ref 20.0–28.0)
Bicarbonate: 20.1 mmol/L (ref 20.0–28.0)
Calcium, Ion: 1.14 mmol/L — ABNORMAL LOW (ref 1.15–1.40)
Calcium, Ion: 1.14 mmol/L — ABNORMAL LOW (ref 1.15–1.40)
Calcium, Ion: 1.15 mmol/L (ref 1.15–1.40)
HCT: 46 % (ref 39.0–52.0)
HCT: 45 % (ref 39.0–52.0)
HCT: 44 % (ref 39.0–52.0)
Hemoglobin: 15.6 g/dL (ref 13.0–17.0)
Hemoglobin: 15.3 g/dL (ref 13.0–17.0)
Hemoglobin: 15 g/dL (ref 13.0–17.0)
O2 Saturation: 100 %
O2 Saturation: 91 %
O2 Saturation: 97 %
Patient temperature: 36.2
Potassium: 3.8 mmol/L (ref 3.5–5.1)
Potassium: 3.7 mmol/L (ref 3.5–5.1)
Potassium: 3 mmol/L — ABNORMAL LOW (ref 3.5–5.1)
Sodium: 139 mmol/L (ref 135–145)
Sodium: 139 mmol/L (ref 135–145)
Sodium: 139 mmol/L (ref 135–145)
TCO2: 25 mmol/L (ref 22–32)
TCO2: 24 mmol/L (ref 22–32)
TCO2: 21 mmol/L — ABNORMAL LOW (ref 22–32)
pCO2 arterial: 52.9 mmHg — ABNORMAL HIGH (ref 32.0–48.0)
pCO2 arterial: 42 mmHg (ref 32.0–48.0)
pCO2 arterial: 30 mmHg — ABNORMAL LOW (ref 32.0–48.0)
pH, Arterial: 7.249 — ABNORMAL LOW (ref 7.350–7.450)
pH, Arterial: 7.333 — ABNORMAL LOW (ref 7.350–7.450)
pH, Arterial: 7.434 (ref 7.350–7.450)
pO2, Arterial: 479 mmHg — ABNORMAL HIGH (ref 83.0–108.0)
pO2, Arterial: 62 mmHg — ABNORMAL LOW (ref 83.0–108.0)
pO2, Arterial: 83 mmHg (ref 83.0–108.0)

## 2019-05-10 LAB — BASIC METABOLIC PANEL
Anion gap: 17 — ABNORMAL HIGH (ref 5–15)
Anion gap: 11 (ref 5–15)
BUN: 6 mg/dL (ref 6–20)
BUN: 7 mg/dL (ref 6–20)
CO2: 19 mmol/L — ABNORMAL LOW (ref 22–32)
CO2: 22 mmol/L (ref 22–32)
Calcium: 8.7 mg/dL — ABNORMAL LOW (ref 8.9–10.3)
Calcium: 8.5 mg/dL — ABNORMAL LOW (ref 8.9–10.3)
Chloride: 105 mmol/L (ref 98–111)
Chloride: 106 mmol/L (ref 98–111)
Creatinine, Ser: 1.49 mg/dL — ABNORMAL HIGH (ref 0.61–1.24)
Creatinine, Ser: 1.22 mg/dL (ref 0.61–1.24)
GFR calc Af Amer: >60 mL/min (ref >60)
GFR calc Af Amer: >60 mL/min (ref >60)
GFR calc non Af Amer: 54 mL/min — ABNORMAL LOW (ref >60)
GFR calc non Af Amer: >60 mL/min (ref >60)
Glucose, Bld: 174 mg/dL — ABNORMAL HIGH (ref 70–99)
Glucose, Bld: 167 mg/dL — ABNORMAL HIGH (ref 70–99)
Potassium: 2.8 mmol/L — ABNORMAL LOW (ref 3.5–5.1)
Potassium: 3.8 mmol/L (ref 3.5–5.1)
Sodium: 141 mmol/L (ref 135–145)
Sodium: 139 mmol/L (ref 135–145)

## 2019-05-10 LAB — GLUCOSE, CAPILLARY
Glucose-Capillary: 154 mg/dL — ABNORMAL HIGH (ref 70–99)
Glucose-Capillary: 145 mg/dL — ABNORMAL HIGH (ref 70–99)
Glucose-Capillary: 125 mg/dL — ABNORMAL HIGH (ref 70–99)
Glucose-Capillary: 118 mg/dL — ABNORMAL HIGH (ref 70–99)
Glucose-Capillary: 124 mg/dL — ABNORMAL HIGH (ref 70–99)
Glucose-Capillary: 169 mg/dL — ABNORMAL HIGH (ref 70–99)

## 2019-05-10 LAB — CBC WITH DIFFERENTIAL/PLATELET
Abs Immature Granulocytes: 0.35 10*3/uL — ABNORMAL HIGH (ref 0.00–0.07)
Basophils Absolute: 0.1 10*3/uL (ref 0.0–0.1)
Basophils Relative: 1 %
Eosinophils Absolute: 0.1 10*3/uL (ref 0.0–0.5)
Eosinophils Relative: 1 %
HCT: 47.6 % (ref 39.0–52.0)
Hemoglobin: 15.5 g/dL (ref 13.0–17.0)
Immature Granulocytes: 2 %
Lymphocytes Relative: 18 %
Lymphs Abs: 2.8 10*3/uL (ref 0.7–4.0)
MCH: 29.1 pg (ref 26.0–34.0)
MCHC: 32.6 g/dL (ref 30.0–36.0)
MCV: 89.5 fL (ref 80.0–100.0)
Monocytes Absolute: 0.9 10*3/uL (ref 0.1–1.0)
Monocytes Relative: 6 %
Neutro Abs: 11.3 10*3/uL — ABNORMAL HIGH (ref 1.7–7.7)
Neutrophils Relative %: 72 %
Platelets: 341 10*3/uL (ref 150–400)
RBC: 5.32 MIL/uL (ref 4.22–5.81)
RDW: 13.8 % (ref 11.5–15.5)
WBC: 15.5 10*3/uL — ABNORMAL HIGH (ref 4.0–10.5)
nRBC: 0 % (ref 0.0–0.2)

## 2019-05-10 LAB — TROPONIN I (HIGH SENSITIVITY)
Troponin I (High Sensitivity): 614 ng/L (ref <18)
Troponin I (High Sensitivity): 2928 ng/L (ref <18)

## 2019-05-10 LAB — MRSA PCR SCREENING: MRSA by PCR: NEGATIVE

## 2019-05-10 LAB — RAPID URINE DRUG SCREEN, HOSP PERFORMED
Amphetamines: NOT DETECTED
Barbiturates: NOT DETECTED
Benzodiazepines: POSITIVE — AB
Cocaine: NOT DETECTED
Opiates: NOT DETECTED
Tetrahydrocannabinol: POSITIVE — AB

## 2019-05-10 LAB — LACTIC ACID, PLASMA
Lactic Acid, Venous: 3.9 mmol/L (ref 0.5–1.9)
Lactic Acid, Venous: 1.9 mmol/L (ref 0.5–1.9)

## 2019-05-10 LAB — HEMOGLOBIN A1C
Hgb A1c MFr Bld: 6 % — ABNORMAL HIGH (ref 4.8–5.6)
Mean Plasma Glucose: 125.5 mg/dL

## 2019-05-10 LAB — PROTIME-INR
INR: 1.2 (ref 0.8–1.2)
Prothrombin Time: 15 seconds (ref 11.4–15.2)

## 2019-05-10 LAB — STREP PNEUMONIAE URINARY ANTIGEN: Strep Pneumo Urinary Antigen: NEGATIVE

## 2019-05-10 LAB — SARS CORONAVIRUS 2 BY RT PCR (HOSPITAL ORDER, PERFORMED IN ~~LOC~~ HOSPITAL LAB): SARS Coronavirus 2: NEGATIVE

## 2019-05-10 LAB — POTASSIUM: Potassium: 3.8 mmol/L (ref 3.5–5.1)

## 2019-05-10 LAB — APTT: aPTT: 51 seconds — ABNORMAL HIGH (ref 24–36)

## 2019-05-10 SURGERY — LEFT HEART CATH AND CORONARY ANGIOGRAPHY
Anesthesia: LOCAL

## 2019-05-10 MED ORDER — IOHEXOL 350 MG/ML SOLN
INTRAVENOUS | Status: DC | PRN
  Administered 2019-05-10: 65 mL via INTRA_ARTERIAL

## 2019-05-10 MED ORDER — ASPIRIN 300 MG RE SUPP
300.0000 mg | Freq: Once | RECTAL | Status: AC
  Administered 2019-05-10: 300 mg via RECTAL

## 2019-05-10 MED ORDER — ORAL CARE MOUTH RINSE
15.0000 mL | OROMUCOSAL | Status: DC
  Administered 2019-05-11 – 2019-05-12 (×17): 15 mL via OROMUCOSAL

## 2019-05-10 MED ORDER — HEPARIN (PORCINE) 25000 UT/250ML-% IV SOLN
950.0000 [IU]/h | INTRAVENOUS | Status: DC
  Administered 2019-05-10: 550 [IU]/h via INTRAVENOUS
  Administered 2019-05-12: 900 [IU]/h via INTRAVENOUS
  Filled 2019-05-10 (×2): qty 250

## 2019-05-10 MED ORDER — PROPOFOL 10 MG/ML IV BOLUS
INTRAVENOUS | Status: DC | PRN
  Administered 2019-05-10: 20 ug via INTRAVENOUS

## 2019-05-10 MED ORDER — INSULIN ASPART 100 UNIT/ML ~~LOC~~ SOLN
0.0000 [IU] | SUBCUTANEOUS | Status: DC
  Administered 2019-05-11: 3 [IU] via SUBCUTANEOUS
  Administered 2019-05-11 – 2019-05-13 (×3): 2 [IU] via SUBCUTANEOUS

## 2019-05-10 MED ORDER — MIDAZOLAM HCL 2 MG/2ML IJ SOLN
2.0000 mg | INTRAMUSCULAR | Status: DC | PRN
  Administered 2019-05-12: 2 mg via INTRAVENOUS

## 2019-05-10 MED ORDER — POTASSIUM CHLORIDE 10 MEQ/100ML IV SOLN
INTRAVENOUS | Status: AC
  Filled 2019-05-10: qty 100

## 2019-05-10 MED ORDER — SODIUM CHLORIDE 0.9 % IV SOLN
INTRAVENOUS | Status: AC | PRN
  Administered 2019-05-10: 250 mL via INTRAVENOUS
  Administered 2019-05-10: 100 mL/h via INTRAVENOUS

## 2019-05-10 MED ORDER — ETOMIDATE 2 MG/ML IV SOLN
INTRAVENOUS | Status: AC | PRN
  Administered 2019-05-10: 20 mg via INTRAVENOUS

## 2019-05-10 MED ORDER — NITROGLYCERIN 1 MG/10 ML FOR IR/CATH LAB
INTRA_ARTERIAL | Status: AC
  Filled 2019-05-10: qty 10

## 2019-05-10 MED ORDER — SODIUM CHLORIDE 0.9% FLUSH
3.0000 mL | Freq: Two times a day (BID) | INTRAVENOUS | Status: DC
  Administered 2019-05-11 – 2019-05-13 (×5): 3 mL via INTRAVENOUS

## 2019-05-10 MED ORDER — FENTANYL CITRATE (PF) 100 MCG/2ML IJ SOLN
100.0000 ug | Freq: Once | INTRAMUSCULAR | Status: AC
  Administered 2019-05-10: 100 ug via INTRAVENOUS

## 2019-05-10 MED ORDER — LIDOCAINE HCL (PF) 1 % IJ SOLN
INTRAMUSCULAR | Status: DC | PRN
  Administered 2019-05-10: 25 mL

## 2019-05-10 MED ORDER — MIDAZOLAM HCL 2 MG/2ML IJ SOLN
INTRAMUSCULAR | Status: AC
  Filled 2019-05-10: qty 2

## 2019-05-10 MED ORDER — ACETAMINOPHEN 325 MG PO TABS: 650.0000 mg | ORAL_TABLET | ORAL | Status: DC | PRN

## 2019-05-10 MED ORDER — CHLORHEXIDINE GLUCONATE CLOTH 2 % EX PADS
6.0000 | MEDICATED_PAD | Freq: Every day | CUTANEOUS | Status: DC
  Administered 2019-05-10 – 2019-05-13 (×4): 6 via TOPICAL

## 2019-05-10 MED ORDER — FENTANYL BOLUS VIA INFUSION
50.0000 ug | INTRAVENOUS | Status: DC | PRN
  Administered 2019-05-12: 50 ug via INTRAVENOUS
  Filled 2019-05-10: qty 50

## 2019-05-10 MED ORDER — MIDAZOLAM 50MG/50ML (1MG/ML) PREMIX INFUSION
0.5000 mg/h | INTRAVENOUS | Status: DC
  Administered 2019-05-10 – 2019-05-11 (×2): 2 mg/h via INTRAVENOUS
  Administered 2019-05-12: 3 mg/h via INTRAVENOUS
  Filled 2019-05-10 (×4): qty 50

## 2019-05-10 MED ORDER — CISATRACURIUM BOLUS VIA INFUSION
0.0500 mg/kg | INTRAVENOUS | Status: DC | PRN
  Filled 2019-05-10: qty 4

## 2019-05-10 MED ORDER — NOREPINEPHRINE 4 MG/250ML-% IV SOLN
0.0000 ug/min | INTRAVENOUS | Status: DC
  Administered 2019-05-12: 14 ug/min via INTRAVENOUS
  Filled 2019-05-10 (×2): qty 250

## 2019-05-10 MED ORDER — SODIUM CHLORIDE 0.9 % IV BOLUS
250.0000 mL | Freq: Once | INTRAVENOUS | Status: AC
  Administered 2019-05-10: 250 mL via INTRAVENOUS

## 2019-05-10 MED ORDER — SODIUM CHLORIDE 0.9 % IV SOLN
1.0000 ug/kg/min | INTRAVENOUS | Status: DC
  Administered 2019-05-10: 1 ug/kg/min via INTRAVENOUS
  Administered 2019-05-11: 1.5 ug/kg/min via INTRAVENOUS
  Filled 2019-05-10 (×3): qty 20

## 2019-05-10 MED ORDER — FENTANYL 2500MCG IN NS 250ML (10MCG/ML) PREMIX INFUSION
100.0000 ug/h | INTRAVENOUS | Status: DC
  Administered 2019-05-10: 200 ug/h via INTRAVENOUS
  Administered 2019-05-11: 300 ug/h via INTRAVENOUS
  Administered 2019-05-11 (×2): 200 ug/h via INTRAVENOUS
  Administered 2019-05-12: 300 ug/h via INTRAVENOUS
  Filled 2019-05-10 (×5): qty 250

## 2019-05-10 MED ORDER — SODIUM CHLORIDE 0.9 % IV SOLN
INTRAVENOUS | Status: AC
  Administered 2019-05-10: 18:00:00 via INTRAVENOUS

## 2019-05-10 MED ORDER — PROPOFOL 1000 MG/100ML IV EMUL
25.0000 ug/kg/min | INTRAVENOUS | Status: DC
  Administered 2019-05-10: 100 ug/kg/min via INTRAVENOUS
  Filled 2019-05-10: qty 100

## 2019-05-10 MED ORDER — CHLORHEXIDINE GLUCONATE 0.12% ORAL RINSE (MEDLINE KIT)
15.0000 mL | Freq: Two times a day (BID) | OROMUCOSAL | Status: DC
  Administered 2019-05-10 – 2019-05-13 (×6): 15 mL via OROMUCOSAL

## 2019-05-10 MED ORDER — SODIUM CHLORIDE 0.9 % IV SOLN
250.0000 mL | INTRAVENOUS | Status: DC | PRN
  Administered 2019-05-13: 250 mL via INTRAVENOUS

## 2019-05-10 MED ORDER — CISATRACURIUM BOLUS VIA INFUSION
0.1000 mg/kg | Freq: Once | INTRAVENOUS | Status: AC
  Administered 2019-05-10: 7.3 mg via INTRAVENOUS
  Filled 2019-05-10: qty 8

## 2019-05-10 MED ORDER — HYDRALAZINE HCL 20 MG/ML IJ SOLN: 10.0000 mg | INTRAMUSCULAR | Status: AC | PRN

## 2019-05-10 MED ORDER — HEPARIN (PORCINE) IN NACL 1000-0.9 UT/500ML-% IV SOLN
INTRAVENOUS | Status: AC
  Filled 2019-05-10: qty 1000

## 2019-05-10 MED ORDER — PANTOPRAZOLE SODIUM 40 MG IV SOLR
40.0000 mg | Freq: Every day | INTRAVENOUS | Status: DC
  Administered 2019-05-10: 40 mg via INTRAVENOUS
  Filled 2019-05-10: qty 40

## 2019-05-10 MED ORDER — LABETALOL HCL 5 MG/ML IV SOLN: 10.0000 mg | INTRAVENOUS | Status: AC | PRN

## 2019-05-10 MED ORDER — HEPARIN SODIUM (PORCINE) 1000 UNIT/ML IJ SOLN
4000.0000 [IU] | Freq: Once | INTRAMUSCULAR | Status: AC
  Administered 2019-05-10: 4000 [IU] via INTRAVENOUS
  Filled 2019-05-10: qty 4

## 2019-05-10 MED ORDER — SODIUM CHLORIDE 0.9 % IV SOLN
INTRAVENOUS | Status: DC
  Administered 2019-05-11 (×2): via INTRAVENOUS

## 2019-05-10 MED ORDER — PROPOFOL 1000 MG/100ML IV EMUL
5.0000 ug/kg/min | INTRAVENOUS | Status: DC
Start: 2019-05-10 — End: 2019-05-10
  Administered 2019-05-10: 100 ug/kg/min via INTRAVENOUS

## 2019-05-10 MED ORDER — MIDAZOLAM HCL 2 MG/2ML IJ SOLN
INTRAMUSCULAR | Status: DC | PRN
  Administered 2019-05-10: 2 mg via INTRAVENOUS

## 2019-05-10 MED ORDER — ARTIFICIAL TEARS OPHTHALMIC OINT
1.0000 "application " | TOPICAL_OINTMENT | Freq: Three times a day (TID) | OPHTHALMIC | Status: DC
  Administered 2019-05-10 – 2019-05-12 (×5): 1 via OPHTHALMIC
  Filled 2019-05-10: qty 3.5

## 2019-05-10 MED ORDER — POTASSIUM CHLORIDE 10 MEQ/50ML IV SOLN
10.0000 meq | INTRAVENOUS | Status: AC
  Administered 2019-05-10 – 2019-05-11 (×4): 10 meq via INTRAVENOUS
  Filled 2019-05-10: qty 50

## 2019-05-10 MED ORDER — FENTANYL CITRATE (PF) 100 MCG/2ML IJ SOLN
INTRAMUSCULAR | Status: AC
  Filled 2019-05-10: qty 2

## 2019-05-10 MED ORDER — HEPARIN SODIUM (PORCINE) 1000 UNIT/ML IJ SOLN
INTRAMUSCULAR | Status: AC
  Filled 2019-05-10: qty 1

## 2019-05-10 MED ORDER — HEPARIN (PORCINE) IN NACL 1000-0.9 UT/500ML-% IV SOLN
INTRAVENOUS | Status: DC | PRN
  Administered 2019-05-10 (×2): 500 mL

## 2019-05-10 MED ORDER — LIDOCAINE HCL (PF) 1 % IJ SOLN
INTRAMUSCULAR | Status: AC
  Filled 2019-05-10: qty 30

## 2019-05-10 MED ORDER — SUCCINYLCHOLINE CHLORIDE 20 MG/ML IJ SOLN
INTRAMUSCULAR | Status: AC | PRN
  Administered 2019-05-10: 100 mg via INTRAVENOUS

## 2019-05-10 MED ORDER — ONDANSETRON HCL 4 MG/2ML IJ SOLN
4.0000 mg | Freq: Four times a day (QID) | INTRAMUSCULAR | Status: DC | PRN
  Administered 2019-05-12 – 2019-05-13 (×2): 4 mg via INTRAVENOUS
  Filled 2019-05-10 (×2): qty 2

## 2019-05-10 MED ORDER — SODIUM CHLORIDE 0.9% FLUSH: 3.0000 mL | INTRAVENOUS | Status: DC | PRN

## 2019-05-10 MED ORDER — FENTANYL CITRATE (PF) 100 MCG/2ML IJ SOLN
INTRAMUSCULAR | Status: DC | PRN
  Administered 2019-05-10: 100 ug via INTRAVENOUS

## 2019-05-10 MED ORDER — MIDAZOLAM HCL 2 MG/2ML IJ SOLN
INTRAMUSCULAR | Status: AC
  Administered 2019-05-10: 2 mg
  Filled 2019-05-10: qty 2

## 2019-05-10 MED ORDER — VERAPAMIL HCL 2.5 MG/ML IV SOLN
INTRAVENOUS | Status: AC
  Filled 2019-05-10: qty 2

## 2019-05-10 MED ORDER — ASPIRIN 300 MG RE SUPP: 300.0000 mg | RECTAL | Status: AC

## 2019-05-10 SURGICAL SUPPLY — 18 items
CATH DXT MULTI JL4 JR4 ANG PIG (CATHETERS) ×2 IMPLANT
CATH LAUNCHER 6FR JR4 (CATHETERS) ×2 IMPLANT
CLOSURE MYNX CONTROL 6F/7F (Vascular Products) ×2 IMPLANT
GLIDESHEATH SLEND SS 6F .021 (SHEATH) ×2 IMPLANT
GUIDEWIRE INQWIRE 1.5J.035X260 (WIRE) ×1 IMPLANT
INQWIRE 1.5J .035X260CM (WIRE) ×2
KIT ENCORE 26 ADVANTAGE (KITS) ×2 IMPLANT
KIT HEART LEFT (KITS) ×2 IMPLANT
KIT HEMO VALVE WATCHDOG (MISCELLANEOUS) ×2 IMPLANT
KIT MICROPUNCTURE NIT STIFF (SHEATH) ×2 IMPLANT
PACK CARDIAC CATHETERIZATION (CUSTOM PROCEDURE TRAY) ×2 IMPLANT
SHEATH PINNACLE 6F 10CM (SHEATH) ×2 IMPLANT
SHEATH PROBE COVER 6X72 (BAG) ×2 IMPLANT
SYR MEDRAD MARK 7 150ML (SYRINGE) ×2 IMPLANT
TRANSDUCER W/STOPCOCK (MISCELLANEOUS) ×2 IMPLANT
TUBING CIL FLEX 10 FLL-RA (TUBING) ×2 IMPLANT
WIRE ASAHI PROWATER 180CM (WIRE) ×2 IMPLANT
WIRE EMERALD 3MM-J .035X150CM (WIRE) ×2 IMPLANT

## 2019-05-10 NOTE — Progress Notes (Signed)
This chaplain responded to the page for Pt. Code Stemi.  The chaplain phoned the ED and learned from Unit Sec-Brittany, no Pt needs at this time.  The chaplain offered F/U spiritual care as needed.
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
## 2019-05-10 NOTE — ED Notes (Signed)
Pt intubated successfully. 26 at the lip

## 2019-05-10 NOTE — ED Notes (Addendum)
Belongings bagged and given to pt's sister

## 2019-05-10 NOTE — Procedures (Signed)
Arterial Catheter Insertion Procedure Note Aaron Avery [id number] 1965-08-09  Procedure: Insertion of Arterial Catheter  Indications: Blood pressure monitoring  Procedure Details Consent: Risks of procedure as well as the alternatives and risks of each were explained to the (patient/caregiver).  Consent for procedure obtained. Time Out: Verified patient identification, verified procedure, site/side was marked, verified correct patient position, special equipment/implants available, medications/allergies/relevent history reviewed, required imaging and test results available.  Performed  Maximum sterile technique was used including antiseptics, cap, gloves, gown, hand hygiene, mask and sheet. Skin prep: Chlorhexidine; local anesthetic administered 20 gauge catheter was inserted into left radial artery using the Seldinger technique. ULTRASOUND GUIDANCE USED: YES Evaluation Blood flow good; BP tracing good. Complications: No apparent complications.   Aaron Avery 05/10/2019
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
## 2019-05-10 NOTE — ED Notes (Signed)
Doug([facility]/Activate [facility] [employer]) called @ 1622-per Dr. Roslynn Amble called by Levada Dy

## 2019-05-10 NOTE — Progress Notes (Signed)
ETT pulled back 1cm per NP Rosana Hoes order.

## 2019-05-10 NOTE — Progress Notes (Signed)
Pt patient to and from CT w/o complications. Uneventful trip. Patient transported on 100%.
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
## 2019-05-10 NOTE — Progress Notes (Signed)
EEG complete - results pending 

## 2019-05-10 NOTE — Progress Notes (Signed)
vLTM started Neurology notified  RN shown how to use event button

## 2019-05-10 NOTE — Progress Notes (Deleted)
CARDIOLOGY CONSULT NOTE  Patient ID: JAG OBENHAUS MRN: [id number] DOB/AGE: 51-Jan-1967 51 y.o.  Admit date: 05/10/2019 Referring Physician: [facility] Reason for Consultation:  Cardiac arrest  HPI:   53 year old Caucasian male, smoker, no other known medical comorbidities s/p cardiac arrest, and inferolateral STEMI.  Patient came out of the home General, and was found unresponsive in his car.  Bystanders removed him out of his car and performed CPR, and AED shock x1.  On arrival to ED, patient was unresponsive and was intubated.  He was hemodynamically stable.  EKG showed inferolateral STEMI.  He did not have any further VF/VT/PEA while in the hospital.  He was taken emergently to Cath Lab.  No culprit vessel was identified on coronary angiogram.  EF was around 35-40%.  Echocardiogram pending.  Patient Sister Marylene Land, who works at [employer] in the hospital, resident at bedside.  No known Covid symptoms or exposure, according to her.  Patient had been having to take for several days.  More recently, he had been feeling more tired and fatigued with dyspnea on exertion.  He is Personnel officer by profession.  Recently, he had been in and out of work.  He smokes half pack a day, and uses cannabis every day.  No alcohol use, as per sister.  Patient is adopted by Angela's mother, and there is no knowledge about his biological parents medical history.    Past Medical History:  Diagnosis Date  . Toothache      Past Surgical History:  Procedure Laterality Date  . None       Family History  Adopted: Yes     Social History: Social History   Socioeconomic History  . Marital status: Divorced    Spouse name: Not on file  . Number of children: Not on file  . Years of education: Not on file  . Highest education level: Not on file  Occupational History  . Not on file  Social Needs  . Financial resource strain: Not on file  . Food insecurity    Worry: Not on file   Inability: Not on file  . Transportation needs    Medical: Not on file    Non-medical: Not on file  Tobacco Use  . Smoking status: Current Every Day Smoker    Packs/day: 1.00    Types: Cigarettes  . Smokeless tobacco: Never Used  Substance and Sexual Activity  . Alcohol use: No  . Drug use: Yes    Types: Marijuana  . Sexual activity: Not on file  Lifestyle  . Physical activity    Days per week: Not on file    Minutes per session: Not on file  . Stress: Not on file  Relationships  . Social Musician on phone: Not on file    Gets together: Not on file    Attends religious service: Not on file    Active member of club or organization: Not on file    Attends meetings of clubs or organizations: Not on file    Relationship status: Not on file  . Intimate partner violence    Fear of current or ex partner: Not on file    Emotionally abused: Not on file    Physically abused: Not on file    Forced sexual activity: Not on file  Other Topics Concern  . Not on file  Social History Narrative  . Not on file     Medications Prior to Admission  Medication Sig  Dispense Refill Last Dose  . amoxicillin (AMOXIL) 500 MG capsule Take 1 capsule (500 mg total) by mouth 3 (three) times daily. 30 capsule 0   . cephALEXin (KEFLEX) 500 MG capsule Take 1 capsule (500 mg total) by mouth 4 (four) times daily. 28 capsule 0   . naproxen (NAPROSYN) 500 MG tablet Take 1 tablet (500 mg total) by mouth 2 (two) times daily. 30 tablet 0   . penicillin v potassium (VEETID) 500 MG tablet Take 1 tablet (500 mg total) by mouth 3 (three) times daily. 30 tablet 0   . traMADol (ULTRAM) 50 MG tablet Take 1 tablet (50 mg total) by mouth every 6 (six) hours as needed for severe pain. 10 tablet 0     Review of Systems  Unable to perform ROS: intubated      Physical Exam: Physical Exam  Constitutional: He appears well-developed and well-nourished. No distress.  HENT:  Head: Normocephalic and  atraumatic.  Eyes: Pupils are equal, round, and reactive to light. Conjunctivae are normal.  Neck: No JVD present.  Cardiovascular: Intact distal pulses. Tachycardia present.  No murmur heard. Pulmonary/Chest: Effort normal and breath sounds normal. He has no wheezes. He has no rales.  Abdominal: Soft. Bowel sounds are normal. There is no rebound.  Musculoskeletal:        General: No edema.  Lymphadenopathy:    He has no cervical adenopathy.  Neurological: No cranial nerve deficit.  Intubated, sedated. Bilateral posturing. No purposeful movement.   Skin: Skin is warm and dry.  Psychiatric:  Could not assess  Nursing note and vitals reviewed.    Labs:   Lab Results  Component Value Date   WBC 15.5 (H) 05/10/2019   HGB 16.0 05/10/2019   HCT 47.0 05/10/2019   MCV 89.5 05/10/2019   PLT 341 05/10/2019    Recent Labs  Lab 05/10/19 1605 05/10/19 1634  NA 141 140  K 2.8* 2.7*  CL 105 103  CO2 19*  --   BUN 6 6  CREATININE 1.49* 1.40*  CALCIUM 8.7*  --   GLUCOSE 174* 170*      Radiology: Dg Chest Port 1 View  Result Date: 05/10/2019 CLINICAL DATA:  Intubation EXAM: PORTABLE CHEST 1 VIEW COMPARISON:  None. FINDINGS: Endotracheal tube tip is at or just above the carina. Esophageal tube tip below the diaphragm but incompletely visualized. No focal airspace opacity, pleural effusion or pneumothorax. Normal heart size. IMPRESSION: 1. Endotracheal tube tip at or just above the carina 2. Esophageal tube tip below the diaphragm but incompletely visualized 3. The lungs are grossly clear Electronically Signed   By: Jasmine Pang M.D.   On: 05/10/2019 16:29    Scheduled Meds: Continuous Infusions: . propofol (DIPRIVAN) infusion     PRN Meds:.  CARDIAC STUDIES:  Coronary angiography 05/10/2019: LM: Normal LAD: Medium sized. Prox LAD/ostial D1 50% disease. TIMI III flow LCx:  Medium sized. LCx with small OM branches.  RCA: Medium sized. Normal  LVEDP 20 mmHg LVEF 35-40%  with global hypokinesis  EKG 05/10/2019 1607: Sinus tachycardia 101 bpm. Inferolateral STEMI  STE resolved on telemetry, during the procedure.   Echocardiogram pending: LV gram: EF around 35-40% with global hypokinesis  Assessment & Recommendations:  53 year old Caucasian male, smoker, no other known medical comorbidities s/p cardiac arrest, and inferolateral STEMI.  Cardiac arrest: Of hospital cardiac arrest, reportedly shockable rhythm, defibrillated x1. No coronary etiology identified. K 2.7, will be replaced. Supportive care and hypothermia protocol as per critical care.  Recommend echocardiogram. Given elevated troponin and inferolateral STE on presentation, recommend heparin, if no other contraindication.  CRITICAL CARE Performed by: Vernell Leep   Total critical care time: 35 minutes   Critical care time was exclusive of separately billable procedures and treating other patients.   Critical care was necessary to treat or prevent imminent or life-threatening deterioration.   Critical care was time spent personally by me on the following activities: development of treatment plan with patient and/or surrogate as well as nursing, discussions with consultants, evaluation of patient's response to treatment, examination of patient, obtaining history from patient or surrogate, ordering and performing treatments and interventions, ordering and review of laboratory studies, ordering and review of radiographic studies, pulse oximetry and re-evaluation of patient's condition.      Nigel Mormon, MD 05/10/2019, 5:52 PM [facility] Pager: [phone] Office: [phone] If no answer Cell [phone]

## 2019-05-10 NOTE — ED Notes (Signed)
Activated code stemi with thomas from [place]

## 2019-05-10 NOTE — Procedures (Signed)
Central Venous Catheter Insertion Procedure Note Aaron Avery [id number] [birth year]  Procedure: Insertion of Central Venous Catheter Indications: Assessment of intravascular volume, Drug and/or fluid administration and Frequent blood sampling  Procedure Details Consent: Risks of procedure as well as the alternatives and risks of each were explained to the (patient/caregiver).  Consent for procedure obtained. Time Out: Verified patient identification, verified procedure, site/side was marked, verified correct patient position, special equipment/implants available, medications/allergies/relevent history reviewed, required imaging and test results available.  Performed  Maximum sterile technique was used including antiseptics, cap, gloves, gown, hand hygiene, mask and sheet. Skin prep: Chlorhexidine x2; local anesthetic administered A antimicrobial bonded/coated triple lumen catheter was placed in the right internal jugular vein using the Seldinger technique at 18cm.  Evaluation Blood flow good Complications: No apparent complications Patient did tolerate procedure well. Chest X-ray ordered to verify placement.  CXR: pending.  Johnsie Cancel, NP-C [facility] After hours pager: [phone]. 05/10/2019, 9:17 PM

## 2019-05-10 NOTE — Progress Notes (Signed)
RT note-xray confirmed in ED, ETT pulled back 1cm per MD. Patient is currently in Cath Lab and will be transferring to 2 H.

## 2019-05-10 NOTE — Progress Notes (Addendum)
ANTICOAGULATION CONSULT NOTE - Initial Consult  Pharmacy Consult for heparin  Indication: chest pain/ACS  No Known Allergies  Patient Measurements: Height: 5\' 7"  (170.2 cm) IBW/kg (Calculated) : 66.1 Estimated wt: 160 lb; 72.7kg  Vital Signs: Temp: 94.1 F (34.5 C) (11/17 1635) Temp Source: Rectal (11/17 1635) BP: 177/155 (11/17 1740) Pulse Rate: 0 (11/17 1741)  Labs: Recent Labs    05/10/19 1605 05/10/19 1634  HGB 15.5 16.0  HCT 47.6 47.0  PLT 341  --   CREATININE 1.49* 1.40*  TROPONINIHS 614*  --     CrCl cannot be calculated (Unknown ideal weight.).   Medical History: History reviewed. No pertinent past medical history.  Medications:  Medications Prior to Admission  Medication Sig Dispense Refill Last Dose  . amoxicillin (AMOXIL) 500 MG capsule Take 1 capsule (500 mg total) by mouth 3 (three) times daily. 30 capsule 0   . cephALEXin (KEFLEX) 500 MG capsule Take 1 capsule (500 mg total) by mouth 4 (four) times daily. 28 capsule 0   . naproxen (NAPROSYN) 500 MG tablet Take 1 tablet (500 mg total) by mouth 2 (two) times daily. 30 tablet 0   . penicillin v potassium (VEETID) 500 MG tablet Take 1 tablet (500 mg total) by mouth 3 (three) times daily. 30 tablet 0   . traMADol (ULTRAM) 50 MG tablet Take 1 tablet (50 mg total) by mouth every 6 (six) hours as needed for severe pain. 10 tablet 0     Assessment: 53 yo male s/p cardiac arrest and cath to start heparin 8 hours post sheath pull (removed ~ 5:45pm). He will be on the hypothermia protocol  Goal of Therapy:  Heparin level 0.3-0.7 units/ml Monitor platelets by anticoagulation protocol: Yes   Plan:  -Start heparin at 550 units/hr at 2am on 11/18 -Heparin level in 6 hours and daily wth CBC daily  Hildred Laser, PharmD Clinical Pharmacist **Pharmacist phone directory can now be found on [email or web address] (PW [email or web address]).  Listed under [place].

## 2019-05-10 NOTE — Progress Notes (Signed)
eLink Physician-Brief Progress Note Patient Name: Aaron Avery DOB: 1965-08-20 MRN: [id number]   Date of Service  05/10/2019  HPI/Events of Note  Hypotension on Propofol, Pt is on a Nimbex infusion.  eICU Interventions  Discontinue Propofol, Versed infusion started after a 2 mg  iv bolus, 250 ml iv fluid bolus x 1, BIS monitor ordered, Norepinephrine started temporarily, plan is to wen Pt off all pressors once blood pressure stabilizes.        Kerry Kass Ogan 05/10/2019, 10:36 PM

## 2019-05-11 ENCOUNTER — Inpatient Hospital Stay (HOSPITAL_COMMUNITY): Payer: Self-pay

## 2019-05-11 ENCOUNTER — Encounter (HOSPITAL_COMMUNITY): Payer: Self-pay | Admitting: Cardiology

## 2019-05-11 DIAGNOSIS — Z789 Other specified health status: Secondary | ICD-10-CM

## 2019-05-11 LAB — POCT I-STAT 7, (LYTES, BLD GAS, ICA,H+H)
Acid-base deficit: 5 mmol/L — ABNORMAL HIGH (ref 0.0–2.0)
Acid-base deficit: 5 mmol/L — ABNORMAL HIGH (ref 0.0–2.0)
Bicarbonate: 19.2 mmol/L — ABNORMAL LOW (ref 20.0–28.0)
Bicarbonate: 20.7 mmol/L (ref 20.0–28.0)
Calcium, Ion: 1.17 mmol/L (ref 1.15–1.40)
Calcium, Ion: 1.2 mmol/L (ref 1.15–1.40)
HCT: 45 % (ref 39.0–52.0)
HCT: 47 % (ref 39.0–52.0)
Hemoglobin: 15.3 g/dL (ref 13.0–17.0)
Hemoglobin: 16 g/dL (ref 13.0–17.0)
O2 Saturation: 96 %
O2 Saturation: 97 %
Patient temperature: 32.6
Patient temperature: 33
Potassium: 3.7 mmol/L (ref 3.5–5.1)
Potassium: 3 mmol/L — ABNORMAL LOW (ref 3.5–5.1)
Sodium: 141 mmol/L (ref 135–145)
Sodium: 142 mmol/L (ref 135–145)
TCO2: 20 mmol/L — ABNORMAL LOW (ref 22–32)
TCO2: 22 mmol/L (ref 22–32)
pCO2 arterial: 26.3 mmHg — ABNORMAL LOW (ref 32.0–48.0)
pCO2 arterial: 31.9 mmHg — ABNORMAL LOW (ref 32.0–48.0)
pH, Arterial: 7.452 — ABNORMAL HIGH (ref 7.350–7.450)
pH, Arterial: 7.401 (ref 7.350–7.450)
pO2, Arterial: 59 mmHg — ABNORMAL LOW (ref 83.0–108.0)
pO2, Arterial: 77 mmHg — ABNORMAL LOW (ref 83.0–108.0)

## 2019-05-11 LAB — MAGNESIUM
Magnesium: 2.1 mg/dL (ref 1.7–2.4)
Magnesium: 2.1 mg/dL (ref 1.7–2.4)
Magnesium: 2 mg/dL (ref 1.7–2.4)

## 2019-05-11 LAB — BASIC METABOLIC PANEL
Anion gap: 10 (ref 5–15)
Anion gap: 10 (ref 5–15)
Anion gap: 9 (ref 5–15)
BUN: 7 mg/dL (ref 6–20)
BUN: 7 mg/dL (ref 6–20)
BUN: 9 mg/dL (ref 6–20)
CO2: 20 mmol/L — ABNORMAL LOW (ref 22–32)
CO2: 21 mmol/L — ABNORMAL LOW (ref 22–32)
CO2: 20 mmol/L — ABNORMAL LOW (ref 22–32)
Calcium: 8.4 mg/dL — ABNORMAL LOW (ref 8.9–10.3)
Calcium: 8.7 mg/dL — ABNORMAL LOW (ref 8.9–10.3)
Calcium: 8.3 mg/dL — ABNORMAL LOW (ref 8.9–10.3)
Chloride: 109 mmol/L (ref 98–111)
Chloride: 109 mmol/L (ref 98–111)
Chloride: 110 mmol/L (ref 98–111)
Creatinine, Ser: 0.96 mg/dL (ref 0.61–1.24)
Creatinine, Ser: 0.91 mg/dL (ref 0.61–1.24)
Creatinine, Ser: 0.7 mg/dL (ref 0.61–1.24)
GFR calc Af Amer: >60 mL/min (ref >60)
GFR calc Af Amer: >60 mL/min (ref >60)
GFR calc Af Amer: >60 mL/min (ref >60)
GFR calc non Af Amer: >60 mL/min (ref >60)
GFR calc non Af Amer: >60 mL/min (ref >60)
GFR calc non Af Amer: >60 mL/min (ref >60)
Glucose, Bld: 128 mg/dL — ABNORMAL HIGH (ref 70–99)
Glucose, Bld: 119 mg/dL — ABNORMAL HIGH (ref 70–99)
Glucose, Bld: 118 mg/dL — ABNORMAL HIGH (ref 70–99)
Potassium: 3.1 mmol/L — ABNORMAL LOW (ref 3.5–5.1)
Potassium: 4.3 mmol/L (ref 3.5–5.1)
Potassium: 3.6 mmol/L (ref 3.5–5.1)
Sodium: 139 mmol/L (ref 135–145)
Sodium: 140 mmol/L (ref 135–145)
Sodium: 139 mmol/L (ref 135–145)

## 2019-05-11 LAB — CBC
HCT: 45.5 % (ref 39.0–52.0)
Hemoglobin: 15.6 g/dL (ref 13.0–17.0)
MCH: 29.6 pg (ref 26.0–34.0)
MCHC: 34.3 g/dL (ref 30.0–36.0)
MCV: 86.3 fL (ref 80.0–100.0)
Platelets: 250 10*3/uL (ref 150–400)
RBC: 5.27 MIL/uL (ref 4.22–5.81)
RDW: 13.9 % (ref 11.5–15.5)
WBC: 11.9 10*3/uL — ABNORMAL HIGH (ref 4.0–10.5)
nRBC: 0 % (ref 0.0–0.2)

## 2019-05-11 LAB — GLUCOSE, CAPILLARY
Glucose-Capillary: 143 mg/dL — ABNORMAL HIGH (ref 70–99)
Glucose-Capillary: 161 mg/dL — ABNORMAL HIGH (ref 70–99)
Glucose-Capillary: 131 mg/dL — ABNORMAL HIGH (ref 70–99)
Glucose-Capillary: 131 mg/dL — ABNORMAL HIGH (ref 70–99)
Glucose-Capillary: 121 mg/dL — ABNORMAL HIGH (ref 70–99)
Glucose-Capillary: 116 mg/dL — ABNORMAL HIGH (ref 70–99)
Glucose-Capillary: 110 mg/dL — ABNORMAL HIGH (ref 70–99)
Glucose-Capillary: 114 mg/dL — ABNORMAL HIGH (ref 70–99)
Glucose-Capillary: 112 mg/dL — ABNORMAL HIGH (ref 70–99)
Glucose-Capillary: 104 mg/dL — ABNORMAL HIGH (ref 70–99)
Glucose-Capillary: 107 mg/dL — ABNORMAL HIGH (ref 70–99)
Glucose-Capillary: 110 mg/dL — ABNORMAL HIGH (ref 70–99)
Glucose-Capillary: 96 mg/dL (ref 70–99)

## 2019-05-11 LAB — POTASSIUM: Potassium: 4 mmol/L (ref 3.5–5.1)

## 2019-05-11 LAB — HEPARIN LEVEL (UNFRACTIONATED)
Heparin Unfractionated: <0.1 IU/mL — ABNORMAL LOW (ref 0.30–0.70)
Heparin Unfractionated: 0.33 IU/mL (ref 0.30–0.70)

## 2019-05-11 LAB — ECHOCARDIOGRAM COMPLETE
Height: 67 in
Weight: 2364.39 oz

## 2019-05-11 LAB — PHOSPHORUS
Phosphorus: 1.9 mg/dL — ABNORMAL LOW (ref 2.5–4.6)
Phosphorus: 2.2 mg/dL — ABNORMAL LOW (ref 2.5–4.6)
Phosphorus: 2.3 mg/dL — ABNORMAL LOW (ref 2.5–4.6)

## 2019-05-11 LAB — APTT: aPTT: 45 seconds — ABNORMAL HIGH (ref 24–36)

## 2019-05-11 LAB — PROTIME-INR
INR: 1.2 (ref 0.8–1.2)
Prothrombin Time: 15 seconds (ref 11.4–15.2)

## 2019-05-11 LAB — PROCALCITONIN: Procalcitonin: 0.2 ng/mL

## 2019-05-11 MED ORDER — PRO-STAT SUGAR FREE PO LIQD
30.0000 mL | Freq: Two times a day (BID) | ORAL | Status: DC
  Filled 2019-05-11: qty 30

## 2019-05-11 MED ORDER — POTASSIUM CHLORIDE 10 MEQ/50ML IV SOLN
10.0000 meq | INTRAVENOUS | Status: AC
  Administered 2019-05-11 (×4): 10 meq via INTRAVENOUS
  Filled 2019-05-11 (×4): qty 50

## 2019-05-11 MED ORDER — SODIUM CHLORIDE 0.9 % IV BOLUS
1000.0000 mL | Freq: Once | INTRAVENOUS | Status: AC
  Administered 2019-05-11: 1000 mL via INTRAVENOUS

## 2019-05-11 MED ORDER — VITAL HIGH PROTEIN PO LIQD: 1000.0000 mL | ORAL | Status: DC

## 2019-05-11 MED ORDER — PANTOPRAZOLE SODIUM 40 MG IV SOLR
40.0000 mg | Freq: Two times a day (BID) | INTRAVENOUS | Status: DC
  Administered 2019-05-11 – 2019-05-13 (×6): 40 mg via INTRAVENOUS
  Filled 2019-05-11 (×6): qty 40

## 2019-05-11 MED ORDER — VITAL AF 1.2 CAL PO LIQD
1000.0000 mL | ORAL | Status: DC
  Administered 2019-05-11: 1000 mL

## 2019-05-11 MED ORDER — PRO-STAT SUGAR FREE PO LIQD
30.0000 mL | Freq: Every day | ORAL | Status: DC
  Administered 2019-05-11 – 2019-05-12 (×2): 30 mL
  Filled 2019-05-11: qty 30

## 2019-05-11 MED ORDER — SODIUM CHLORIDE 0.9% FLUSH: 10.0000 mL | INTRAVENOUS | Status: DC | PRN

## 2019-05-11 MED ORDER — SODIUM CHLORIDE 0.9% FLUSH
10.0000 mL | Freq: Two times a day (BID) | INTRAVENOUS | Status: DC
  Administered 2019-05-11: 10 mL

## 2019-05-11 MED ORDER — LACTATED RINGERS IV BOLUS
1000.0000 mL | Freq: Once | INTRAVENOUS | Status: AC
  Administered 2019-05-11: 1000 mL via INTRAVENOUS

## 2019-05-11 MED FILL — Nitroglycerin IV Soln 100 MCG/ML in D5W: INTRA_ARTERIAL | Qty: 10 | Status: AC

## 2019-05-11 MED FILL — Verapamil HCl IV Soln 2.5 MG/ML: INTRAVENOUS | Qty: 2 | Status: AC

## 2019-05-11 NOTE — Progress Notes (Signed)
Sputum collected and sent to the lab 

## 2019-05-11 NOTE — Progress Notes (Signed)
ANTICOAGULATION CONSULT NOTE - Initial Consult  Pharmacy Consult for heparin  Indication: chest pain/ACS  No Known Allergies  Patient Measurements: Height: 5\' 7"  (170.2 cm) Weight: 147 lb 12.4 oz (67 kg) IBW/kg (Calculated) : 66.1 Estimated wt: 160 lb; 72.7kg  Vital Signs: Temp: 91.6 F (33.1 C) (11/18 1000) Temp Source: Bladder (11/18 0800) BP: 104/73 (11/18 1000) Pulse Rate: 55 (11/18 0732)  Labs: Recent Labs    05/10/19 1605  05/10/19 1701 05/10/19 1811  05/11/19 0112 05/11/19 0113 05/11/19 0418 05/11/19 0511 05/11/19 0957  HGB 15.5   < > 14.3  --    < > 15.3  --  15.6 16.0  --   HCT 47.6   < > 42.0  --    < > 45.0  --  45.5 47.0  --   PLT 341  --   --   --   --   --   --  250  --   --   APTT  --   --   --  51*  --   --  45*  --   --   --   LABPROT  --   --   --  15.0  --   --  15.0  --   --   --   INR  --   --   --  1.2  --   --  1.2  --   --   --   HEPARINUNFRC  --   --   --   --   --   --   --   --   --  <0.10*  CREATININE 1.49*   < > 1.20 1.22  --   --   --  0.96  --   --   TROPONINIHS 614*  --   --  2,928*  --   --   --   --   --   --    < > = values in this interval not displayed.    Estimated Creatinine Clearance: 85.1 mL/min (by C-G formula based on SCr of 0.96 mg/dL).   Medical History: Past Medical History:  Diagnosis Date  . Toothache     Medications:  Medications Prior to Admission  Medication Sig Dispense Refill Last Dose  . amoxicillin (AMOXIL) 500 MG capsule Take 1 capsule (500 mg total) by mouth 3 (three) times daily. 30 capsule 0   . cephALEXin (KEFLEX) 500 MG capsule Take 1 capsule (500 mg total) by mouth 4 (four) times daily. 28 capsule 0   . naproxen (NAPROSYN) 500 MG tablet Take 1 tablet (500 mg total) by mouth 2 (two) times daily. 30 tablet 0   . penicillin v potassium (VEETID) 500 MG tablet Take 1 tablet (500 mg total) by mouth 3 (three) times daily. 30 tablet 0   . traMADol (ULTRAM) 50 MG tablet Take 1 tablet (50 mg total) by mouth  every 6 (six) hours as needed for severe pain. 10 tablet 0     Assessment: 53 year old male s/p cardiac arrest and cath undergoing TTM. Heparin started this morning, 8 hours after sheath pull.  Heparin level this morning subtherapeutic. H&H stable, no issues with lines or reports of bleeding.   Goal of Therapy:  Heparin level 0.3-0.7 units/ml Monitor platelets by anticoagulation protocol: Yes   Plan:  Increase heparin IV to 850 units/hour (~12 units/hour) Heparin level in 6 hours and daily wth CBC daily Monitor for signs and symptoms of  bleed    Thank you for allowing pharmacy to participate in this patient's care.  Ivet Guerrieri L. Devin Going, [place] PGY1 Pharmacy Resident [phone] 05/11/19      11:03 AM  Please check AMION for all [facility] phone numbers After 10:00 PM, call the [facility] [phone]

## 2019-05-11 NOTE — Progress Notes (Signed)
NAME:  Aaron Avery, MRN:  [id number], DOB:  05-27-1966, LOS: 1 ADMISSION DATE:  05/10/2019, CONSULTATION DATE:  11/17 REFERRING MD:  Dr. Roslynn Amble EDP, CHIEF COMPLAINT:  Cardiac arrest  Brief History   53 year old male admitted after cardiac arrest on 11/17   History of present illness   53 year old male with unknown past medical history who was found unresponsive in his car on 11/17.  EMS was called and upon pulling him out of the vehicle he was found to be unresponsive and pulseless.  CPR was started and he underwent 1 attempt at defibrillation.  Downtime ultimately unknown but it does appear as though resuscitation efforts were brief.  Upon arrival to the emergency department EKG demonstrated ST elevation.  He was intubated for airway protection and was taken emergently to the cardiac catheterization lab.  PCCM was asked to admit.  Sister reports he does not take good care of himself. Eats only red meat and drinks only coca-cola. He has recently had dental pain, but has not had it worked up due to financial barriers. For xseveral months he get diaphoretic and dizzy with exertion.   Past Medical History   has a past medical history of Toothache.   Significant Hospital Events   11/17: admit, LHC: no interventions required.    Consults:  Cardiology  Procedures:  11/17 LHC >>> 11/17 arterial line 11/17 cvc  Significant Diagnostic Tests:  CT head 11/17 > no acute EGG 11/17 > no seizure, global encephalopathy Echo 11/17:  Micro Data:  Blood 11/17 >ngtd resp 11/17: ngtd 11/17 sars2: neg  Antimicrobials:    Interim history/subjective:  11/18: hypothermia ongoing. Bloody contents from og.   Objective   Blood pressure 121/81, pulse (!) 55, temperature (!) 91.6 F (33.1 C), resp. rate 18, height 5\' 7"  (1.702 m), weight 67 kg, SpO2 100 %. CVP:  [2 mmHg-5 mmHg] 2 mmHg  Vent Mode: PRVC FiO2 (%):  [50 %-100 %] 60 % Set Rate:  [14 bmp-20 bmp] 18 bmp Vt Set:  [520 mL] 520  mL PEEP:  [5 cmH20-7 cmH20] 7 cmH20 Plateau Pressure:  [11 cmH20-25 cmH20] 25 cmH20   Intake/Output Summary (Last 24 hours) at 05/11/2019 0836 Last data filed at 05/11/2019 0800 Gross per 24 hour  Intake 1434.78 ml  Output 930 ml  Net 504.78 ml   Filed Weights   05/10/19 1806 05/11/19 0500  Weight: 72.7 kg 67 kg    Examination: General: thin middle aged male on vent, sedated and paralyzed HENT: [place]/AT,  no JVD, ett in place Lungs: Clear bilateral breath sounds Cardiovascular: RRR, no MRG Abdomen: Soft, non-distended Extremities: No acute deformity. No edema Neuro: Unresponsive, no eye opening. Sedated and paralyzed  Resolved Hospital Problem list     Assessment & Plan:   OOH Cardiac arrest: defribrillation done in field, so presumably this is VF/VT in the setting of STEMI given admission EKG, but no significant coronary disease on LHC. Potassium 2.7 on admission. Etiology unclear. - Management per cardiology -heparin gtt per cards - Follow EKG - UDS (+ benzo and thc) - Echocardiogram: pending  Acute encephalopathy in the setting of cardiac arrest: at risk for anoxic injury, but evidence as well for metabolic etiology.  - CT head negative - EEG continuous per protocol - TTM 32F - Fentanyl/Versed for sedation - Nimbex to prevent shivering  Leukocytosis  - cultures: ngtd - cont holding ABX  Dental Pain:  - Monitor - Blood cultures: ngtd  Hyperglycemia with no history of DM -  CBG monitoring and SSI  Bloody GI contents:  -? Ulcer vs gastritis.  -not great amount, will follow periodic h&h (with continuation of heparin gtt) -BID ppi,  -start tf  Hypokalemia:  -replace  Best practice:  Diet: start tf Pain/Anxiety/Delirium protocol (if indicated): Prop and fent for RASS -5 during TTM VAP protocol (if indicated): yes DVT prophylaxis: heparin infusion GI prophylaxis: PPI Glucose control: SSI Mobility: BR Code Status: FULL Family Communication: Sister  updated. Unable to contact Son Disposition: ICU  Labs   CBC: Recent Labs  Lab 05/10/19 1605  05/10/19 1845 05/10/19 2009 05/11/19 0112 05/11/19 0418 05/11/19 0511  WBC 15.5*  --   --   --   --  11.9*  --   NEUTROABS 11.3*  --   --   --   --   --   --   HGB 15.5   < > 15.3 15.0 15.3 15.6 16.0  HCT 47.6   < > 45.0 44.0 45.0 45.5 47.0  MCV 89.5  --   --   --   --  86.3  --   PLT 341  --   --   --   --  250  --    < > = values in this interval not displayed.    Basic Metabolic Panel: Recent Labs  Lab 05/10/19 1605 05/10/19 1634 05/10/19 1644  05/10/19 1701 05/10/19 1811 05/10/19 1845 05/10/19 2009 05/11/19 0112 05/11/19 0418 05/11/19 0511  NA 141 140 139  140  --  139 139 139 139 141 139 142  K 2.8* 2.7* 3.8  3.8   < > 3.9 3.8 3.7 3.0* 3.7 3.1* 3.0*  CL 105 103 104  --  103 106  --   --   --  109  --   CO2 19*  --   --   --   --  22  --   --   --  20*  --   GLUCOSE 174* 170* 178*  --  170* 167*  --   --   --  128*  --   BUN 6 6 6   --  6 7  --   --   --  7  --   CREATININE 1.49* 1.40* 1.30*  --  1.20 1.22  --   --   --  0.96  --   CALCIUM 8.7*  --   --   --   --  8.5*  --   --   --  8.4*  --   MG  --   --   --   --   --   --   --   --   --  2.1  --   PHOS  --   --   --   --   --   --   --   --   --  1.9*  --    < > = values in this interval not displayed.   GFR: Estimated Creatinine Clearance: 85.1 mL/min (by C-G formula based on SCr of 0.96 mg/dL). Recent Labs  Lab 05/10/19 1605 05/10/19 1606 05/10/19 1811 05/11/19 0418  PROCALCITON  --   --   --  0.20  WBC 15.5*  --   --  11.9*  LATICACIDVEN  --  3.9* 1.9  --     Liver Function Tests: No results for input(s): AST, ALT, ALKPHOS, BILITOT, PROT, ALBUMIN in the last 168 hours. No results for input(s): LIPASE,  AMYLASE in the last 168 hours. No results for input(s): AMMONIA in the last 168 hours.  ABG    Component Value Date/Time   PHART 7.401 05/11/2019 0511   PCO2ART 31.9 (L) 05/11/2019 0511   PO2ART  77.0 (L) 05/11/2019 0511   HCO3 20.7 05/11/2019 0511   TCO2 22 05/11/2019 0511   ACIDBASEDEF 5.0 (H) 05/11/2019 0511   O2SAT 97.0 05/11/2019 0511     Coagulation Profile: Recent Labs  Lab 05/10/19 1811 05/11/19 0113  INR 1.2 1.2    Cardiac Enzymes: No results for input(s): CKTOTAL, CKMB, CKMBINDEX, TROPONINI in the last 168 hours.  HbA1C: Hgb A1c MFr Bld  Date/Time Value Ref Range Status  05/10/2019 06:11 PM 6.0 (H) 4.8 - 5.6 % Final    Comment:    (NOTE) Pre diabetes:          5.7%-6.4% Diabetes:              >6.4% Glycemic control for   <7.0% adults with diabetes     CBG: Recent Labs  Lab 05/11/19 0109 05/11/19 0257 05/11/19 0416 05/11/19 0508 05/11/19 0647  GLUCAP 143* 131* 131* 121* 116*     Critical care time: 43 mins     Critical care time: The patient is critically ill with multiple organ systems failure and requires high complexity decision making for assessment and support, frequent evaluation and titration of therapies, application of advanced monitoring technologies and extensive interpretation of multiple databases.  Critical care time 43 mins. This represents my time independent of the NPs time taking care of the pt. This is excluding procedures.    Briant Sites DO After hours pager: [phone]  [facility] 05/11/2019, 8:36 AM

## 2019-05-11 NOTE — Progress Notes (Signed)
EEG maintenance is complete. No skin breakdown at electrode site FP2 F7 P3 continue to monitor

## 2019-05-11 NOTE — Plan of Care (Signed)
  Problem: Nutrition: Goal: Adequate nutrition will be maintained Outcome: Progressing   Problem: Cardiovascular: Goal: Vascular access site(s) Level 0-1 will be maintained Outcome: Progressing   Problem: Skin Integrity: Goal: Risk for impaired skin integrity will be minimized. Outcome: Progressing

## 2019-05-11 NOTE — Procedures (Signed)
Patient Name: Aaron Avery  MRN: [id number]  Epilepsy Attending: Lora Havens  Referring Physician/Provider: Georgann Housekeeper, NP Date: 05/10/2019 Duration: 22.02 mins  Patient history: 53 year old male status post cardiac arrest on TTM.  EEG to evaluate for seizures.  Level of alertness: Comatose/sedated  AEDs during EEG study: Versed  Technical aspects: This EEG study was done with scalp electrodes positioned according to the 10-20 International system of electrode placement. Electrical activity was acquired at a sampling rate of 500Hz  and reviewed with a high frequency filter of 70Hz  and a low frequency filter of 1Hz . EEG data were recorded continuously and digitally stored.   Description: EEG showed continuous polymorphic generalized, maximal bifrontal 6 to 10 Hz delta-alpha activity.  There were also intermittent 1 to 2-second periods of generalized background attenuation.  EEG was reactive to tactile stimulation.  Hyperventilation and photic stimulation were not performed.  Abnormality -Continuous slow, generalized -Background attenuation, generalized  IMPRESSION: This study is suggestive of profound diffuse encephalopathy, nonspecific to etiology but could be related to sedation, anoxic brain injury. No seizures or epileptiform discharges were seen throughout the recording.

## 2019-05-11 NOTE — Progress Notes (Signed)
Subjective:  Intubated, paralyzed.  Objective:  Vital Signs in the last 24 hours: Temp:  [89.4 F (31.9 C)-96.8 F (36 C)] 91.6 F (33.1 C) (11/18 0800) Pulse Rate:  [0-224] 55 (11/18 0732) Resp:  [0-79] 18 (11/18 0800) BP: (72-177)/(52-155) 121/81 (11/18 0800) SpO2:  [0 %-100 %] 100 % (11/18 0800) Arterial Line BP: (75-141)/(49-89) 113/64 (11/18 0730) FiO2 (%):  [50 %-100 %] 60 % (11/18 0732) Weight:  [67 kg-72.7 kg] 67 kg (11/18 0500)  Intake/Output from previous day: 11/17 0701 - 11/18 0700 In: 1350.8 [I.V.:1116.4; IV Piggyback:234.5] Out: 900 [Urine:550; Emesis/NG output:350]  Physical Exam  Constitutional: He appears well-developed and well-nourished. No distress.  HENT:  Head: Normocephalic and atraumatic.  Eyes: Pupils are equal, round, and reactive to light. Conjunctivae are normal.  Neck: No JVD present.  Cardiovascular: Normal rate, regular rhythm and intact distal pulses.  No murmur heard. Pulmonary/Chest: Effort normal and breath sounds normal. He has no wheezes. He has no rales.  Abdominal: Soft. Bowel sounds are normal. There is no rebound.  Musculoskeletal:        General: No edema.  Lymphadenopathy:    He has no cervical adenopathy.  Neurological:  Intubated, paralyzed  Skin: Skin is warm and dry.  Psychiatric: He has a normal mood and affect.  Nursing note and vitals reviewed.    Lab Results: BMP Recent Labs    05/10/19 1605  05/10/19 1701 05/10/19 1811  05/11/19 0112 05/11/19 0418 05/11/19 0511  NA 141   < > 139 139   < > 141 139 142  K 2.8*   < > 3.9 3.8   < > 3.7 3.1* 3.0*  CL 105   < > 103 106  --   --  109  --   CO2 19*  --   --  22  --   --  20*  --   GLUCOSE 174*   < > 170* 167*  --   --  128*  --   BUN 6   < > 6 7  --   --  7  --   CREATININE 1.49*   < > 1.20 1.22  --   --  0.96  --   CALCIUM 8.7*  --   --  8.5*  --   --  8.4*  --   GFRNONAA 54*  --   --  >60  --   --  >60  --   GFRAA >60  --   --  >60  --   --  >60  --    < > =  values in this interval not displayed.    CBC Recent Labs  Lab 05/10/19 1605  05/11/19 0418 05/11/19 0511  WBC 15.5*  --  11.9*  --   RBC 5.32  --  5.27  --   HGB 15.5   < > 15.6 16.0  HCT 47.6   < > 45.5 47.0  PLT 341  --  250  --   MCV 89.5  --  86.3  --   MCH 29.1  --  29.6  --   MCHC 32.6  --  34.3  --   RDW 13.8  --  13.9  --   LYMPHSABS 2.8  --   --   --   MONOABS 0.9  --   --   --   EOSABS 0.1  --   --   --   BASOSABS 0.1  --   --   --    < > =  values in this interval not displayed.   Results for BOLIVAR, KORANDA (MRN [id number]) as of 05/11/2019 08:08  Ref. Range 05/10/2019 16:05 05/10/2019 18:11  Troponin I (High Sensitivity) Latest Ref Range: <18 ng/L 614 (HH) 2,928 (HH)    HEMOGLOBIN A1C Lab Results  Component Value Date   HGBA1C 6.0 (H) 05/10/2019   MPG 125.5 05/10/2019     Cardiac Studies:  Echocardiogram 05/11/2019: Severe global hypokinesis EF 25-30% No significant valvular abnormality.   Coronary angiography 05/10/2019: LM: Normal LAD: Medium sized. Prox LAD/ostial D1 50% disease. TIMI III flow LCx: Medium sized. LCx with small OM branches.  RCA: Medium sized. Normal  LVEDP 20 mmHg LVEF 35-40% with global hypokinesis  EKG11/17/2020 1607: Sinus tachycardia 101 bpm. Inferolateral STEMI  EKG11/18/2020: Sinus bradycardia 49 bpm. No ischemic changes.  EEG 05/11/2019:  This study is suggestive of profound diffuse encephalopathy, nonspecific to etiology but could be related to sedation, anoxic brain injury. No seizures or epileptiform discharges were seen throughout the recording.  Assessment & Recommendations:  66 year oldCaucasian male,smoker, no other known medical comorbidities s/p cardiac arrest, and inferolateral STEMI.  Cardiac arrest: Of hospital cardiac arrest,reportedly shockable rhythm,defibrillated x1. 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 No unstable lesion  seen. K2.7 on presentation, will be replaced. LVEF is 25-30% on echocardiogram, which could be a reason for VF/VT arrest, but does not explain inferolateral STE. No arrhythmia during hospitalization.  At this point, continue supportive care and hypothermia protocol as per critical care. Given elevated troponin and inferolateral STE on presentation, recommend heparin, if no other contraindication. EEG findings s/o profound diffuse encephalopathy, nonspecific to etiology but could be related to sedation, anoxic brain injury. No seizures or epileptiform discharges were seen throughout the recording.    Nigel Mormon, M.D. 05/11/2019, 8:07 AM [facility] Pager: [phone] Office: [phone] If no answer: [phone]

## 2019-05-11 NOTE — Progress Notes (Signed)
[place] Progress Note Patient Name: SILVINO SELMAN DOB: August 27, 1965 MRN: [id number]   Date of Service  05/11/2019  HPI/Events of Note  PO2 59 and PCO2 26 on Fio2 of 50 %, PEEP of 5  eICU Interventions  PEEP increased to 7, RR reduced to 18.        Kerry Kass Ogan 05/11/2019, 1:55 AM

## 2019-05-11 NOTE — Progress Notes (Addendum)
ANTICOAGULATION CONSULT NOTE - Follow Up Consult  Pharmacy Consult for heparin  Indication: chest pain/ACS  No Known Allergies  Patient Measurements: Height: 5\' 7"  (170.2 cm) Weight: 147 lb 12.4 oz (67 kg) IBW/kg (Calculated) : 66.1   Vital Signs: Temp: 92.1 F (33.4 C) (11/18 1600) Temp Source: Bladder (11/18 1600) BP: 110/77 (11/18 1700) Pulse Rate: 64 (11/18 1526)  Labs: Recent Labs    05/10/19 1605  05/10/19 1811  05/11/19 0112 05/11/19 0113 05/11/19 0418 05/11/19 0511 05/11/19 0957 05/11/19 1747  HGB 15.5   < >  --    < > 15.3  --  15.6 16.0  --   --   HCT 47.6   < >  --    < > 45.0  --  45.5 47.0  --   --   PLT 341  --   --   --   --   --  250  --   --   --   APTT  --   --  51*  --   --  45*  --   --   --   --   LABPROT  --   --  15.0  --   --  15.0  --   --   --   --   INR  --   --  1.2  --   --  1.2  --   --   --   --   HEPARINUNFRC  --   --   --   --   --   --   --   --  <0.10* 0.33  CREATININE 1.49*   < > 1.22  --   --   --  0.96  --  0.91 0.70  TROPONINIHS 614*  --  2,928*  --   --   --   --   --   --   --    < > = values in this interval not displayed.    Estimated Creatinine Clearance: 102.1 mL/min (by C-G formula based on SCr of 0.7 mg/dL).   Assessment: 53 year old male S/P cardiac arrest, cath undergoing hypothermia protocol. Heparin started early this morning at 550 units/hr, 8 hours after sheath pull.  Initial heparin level (on rate of 550 units/hr) was subtherapeutic (<0.10 units/ml) this morning. Heparin infusion was increased to 850 units/hr.   Heparin level ~6 hrs after increasing heparin infusion to 850 units/hr was 0.33 units/ml, which is at the low end of the goal range for this patient. H/H, platelets stable. Per RN, no issues with IV or bleeding observed.  Goal of Therapy:  Heparin level 0.3-0.7 units/ml Monitor platelets by anticoagulation protocol: Yes   Plan:  Increase heparin infusion to 900 units/hr Check 6-hr heparin  level Monitor daily heparin level, CBC Monitor for signs and symptoms of bleeding  Gillermina Hu, PharmD, BCPS, [facility] Clinical Pharmacist 05/11/19      6:47 PM

## 2019-05-11 NOTE — Progress Notes (Signed)
  Echocardiogram 2D Echocardiogram has been performed.  Aaron Avery 05/11/2019, 8:29 AM

## 2019-05-11 NOTE — Procedures (Addendum)
Patient Name: Aaron Avery  MRN: [id number]  Epilepsy Attending: Lora Havens  Referring Physician/Provider: Georgann Housekeeper, NP Duration: 05/10/2019 2233 to 05/11/2019 2233  Patient history: 53 year old male status post cardiac arrest on TTM.  EEG to evaluate for seizures.  Level of alertness: Comatose/sedated  AEDs during EEG study: Versed  Technical aspects: This EEG study was done with scalp electrodes positioned according to the 10-20 International system of electrode placement. Electrical activity was acquired at a sampling rate of 500Hz  and reviewed with a high frequency filter of 70Hz  and a low frequency filter of 1Hz . EEG data were recorded continuously and digitally stored.   Description: EEG showed continuous polymorphic generalized 9to 10 Hz alpha activity.  There were also intermittent generalized 2-5Hz  theta-delta slowing.  EEG was reactive to tactile stimulation.  Hyperventilation and photic stimulation were not performed.  Abnormality -Intermittent slow, generalized -Generalized alpha activity  IMPRESSION: This study is suggestive of profound diffuse encephalopathy, nonspecific to etiology but could be related to sedation, anoxic brain injury. No seizures or epileptiform discharges were seen throughout the recording.  EEG appears to be improving compared to yesterday.

## 2019-05-11 NOTE — Progress Notes (Signed)
eLink Physician-Brief Progress Note Patient Name: Aaron Avery DOB: September 07, 1965 MRN: [id number]   Date of Service  05/11/2019  HPI/Events of Note  Hypokalemia- K+ 3.0  eICU Interventions  KCL 10 meq iv Q 1 hour x 4 doses        [name] U Karry Barrilleaux 05/11/2019, 5:36 AM

## 2019-05-11 NOTE — Plan of Care (Signed)
  Problem: Nutrition: Goal: Adequate nutrition will be maintained Outcome: Progressing   Problem: Safety: Goal: Ability to remain free from injury will improve Outcome: Progressing   Problem: Skin Integrity: Goal: Risk for impaired skin integrity will decrease Outcome: Progressing   Problem: Cardiovascular: Goal: Vascular access site(s) Level 0-1 will be maintained Outcome: Progressing   Problem: Cardiac: Goal: Ability to achieve and maintain adequate cardiopulmonary perfusion will improve Outcome: Progressing   Problem: Skin Integrity: Goal: Risk for impaired skin integrity will be minimized. Outcome: Progressing

## 2019-05-11 NOTE — Progress Notes (Signed)
Initial Nutrition Assessment  DOCUMENTATION CODES:   Non-severe (moderate) malnutrition in context of social or environmental circumstances  INTERVENTION:   Tube Feeding:  Vital AF 1.2 at 50 ml/hr Begin TF at 20 ml/hr, titrate by 10 mL q 8 hours until goal rate of 50 ml/hr Pro-Stat 30 mL daily Provides 1540 kcals, 105 g of protein and 972 mL of free water  NUTRITION DIAGNOSIS:   Moderate Malnutrition related to social / environmental circumstances(long hx of dental issues, financial issues) as evidenced by moderate fat depletion, mild muscle depletion.  GOAL:   Patient will meet greater than or equal to 90% of their needs  MONITOR:   Vent status, Labs, Weight trends, TF tolerance  REASON FOR ASSESSMENT:   Consult, Ventilator Enteral/tube feeding initiation and management  ASSESSMENT:   53 yo male admitted after being found unresponsive in his car and admitted post cardiac arrest on 11/17. PMH unknown at time of admission, hx of toothache  TTM at 33 degrees, plan to begin rewarming this evening Patient is currently intubated on ventilator support, paralyzed on nimbex, fentanyl and versed for sedation MV: 9.1 L/min Temp (24hrs), Avg:91.5 F (33.1 C), Min:89.4 F (31.9 C), Max:96.8 F (36 C)  Propofol: OFF  Unable to obtain diet and weight history from patient at this time  OG tube with bloody return  Current wt 67 kg; noted weight of 74.8 kg in 2016 suggesting weight loss if weight encounters are correct  Per MD notes, sister indicating pt does not take care of him. Sister reporting pt only eats red meat and drinks coca-cola. Pt has been experincing dental pain but has not had it worked up due to financial barriers. Pt has been diaphoretic and dizzy with exertion for months.   Looks like patient has hx of dental issues including dental abcess/dental carries in 2016, unsure if pt currently experiencing any issues related to dentition that might affect po  intake  Nutrition focused physical exam suggests malnutrition but very limited history. Pt meets criteria for moderate malnutrition in context of social/environmental as unable to identify illness contributing to malnutrition at this time  Labs: phosphorus 2.2 (L), Creatinine wdl, CBGs 110-116 (ICU goal 140-180) Meds: ss novolog, KCl  NUTRITION - FOCUSED PHYSICAL EXAM:    Most Recent Value  Orbital Region  Moderate depletion  Upper Arm Region  Moderate depletion  Thoracic and Lumbar Region  Moderate depletion  Buccal Region  Unable to assess  Temple Region  Moderate depletion  Clavicle Bone Region  Mild depletion  Clavicle and Acromion Bone Region  No depletion  Scapular Bone Region  No depletion  Dorsal Hand  No depletion  Patellar Region  Unable to assess  Anterior Thigh Region  Unable to assess  Posterior Calf Region  Unable to assess  Edema (RD Assessment)  None       Diet Order:   Diet Order            Diet NPO time specified  Diet effective now              EDUCATION NEEDS:   Not appropriate for education at this time  Skin:  Skin Assessment: Reviewed RN Assessment  Last BM:  11/18  Height:   Ht Readings from Last 1 Encounters:  05/10/19 5\' 7"  (1.702 m)    Weight:   Wt Readings from Last 1 Encounters:  05/11/19 67 kg     BMI:  Body mass index is 23.14 kg/m.  Estimated Nutritional Needs:  Kcal:  1514 kcals  Protein:  104-134 g  Fluid:  >/= 1.5 L   [employer] MS, RDN, LDN, CNSC [phone] Pager  [phone] Weekend/On-Call Pager

## 2019-05-12 ENCOUNTER — Encounter (HOSPITAL_COMMUNITY): Payer: Self-pay | Admitting: Cardiology

## 2019-05-12 DIAGNOSIS — I428 Other cardiomyopathies: Secondary | ICD-10-CM

## 2019-05-12 LAB — POCT I-STAT 7, (LYTES, BLD GAS, ICA,H+H)
Acid-base deficit: 5 mmol/L — ABNORMAL HIGH (ref 0.0–2.0)
Acid-base deficit: 5 mmol/L — ABNORMAL HIGH (ref 0.0–2.0)
Acid-base deficit: 5 mmol/L — ABNORMAL HIGH (ref 0.0–2.0)
Acid-base deficit: 6 mmol/L — ABNORMAL HIGH (ref 0.0–2.0)
Acid-base deficit: 6 mmol/L — ABNORMAL HIGH (ref 0.0–2.0)
Bicarbonate: 19.5 mmol/L — ABNORMAL LOW (ref 20.0–28.0)
Bicarbonate: 19.8 mmol/L — ABNORMAL LOW (ref 20.0–28.0)
Bicarbonate: 21.3 mmol/L (ref 20.0–28.0)
Bicarbonate: 22.2 mmol/L (ref 20.0–28.0)
Bicarbonate: 22.2 mmol/L (ref 20.0–28.0)
Calcium, Ion: 1.22 mmol/L (ref 1.15–1.40)
Calcium, Ion: 1.23 mmol/L (ref 1.15–1.40)
Calcium, Ion: 1.24 mmol/L (ref 1.15–1.40)
Calcium, Ion: 1.25 mmol/L (ref 1.15–1.40)
Calcium, Ion: 1.26 mmol/L (ref 1.15–1.40)
HCT: 45 % (ref 39.0–52.0)
HCT: 45 % (ref 39.0–52.0)
HCT: 45 % (ref 39.0–52.0)
HCT: 46 % (ref 39.0–52.0)
HCT: 47 % (ref 39.0–52.0)
Hemoglobin: 15.3 g/dL (ref 13.0–17.0)
Hemoglobin: 15.3 g/dL (ref 13.0–17.0)
Hemoglobin: 15.3 g/dL (ref 13.0–17.0)
Hemoglobin: 15.6 g/dL (ref 13.0–17.0)
Hemoglobin: 16 g/dL (ref 13.0–17.0)
O2 Saturation: 91 %
O2 Saturation: 92 %
O2 Saturation: 93 %
O2 Saturation: 95 %
O2 Saturation: 95 %
Patient temperature: 33.5
Patient temperature: 34.2
Patient temperature: 34.8
Patient temperature: 35.2
Potassium: 3.9 mmol/L (ref 3.5–5.1)
Potassium: 4 mmol/L (ref 3.5–5.1)
Potassium: 4 mmol/L (ref 3.5–5.1)
Potassium: 4 mmol/L (ref 3.5–5.1)
Potassium: 4.3 mmol/L (ref 3.5–5.1)
Sodium: 140 mmol/L (ref 135–145)
Sodium: 140 mmol/L (ref 135–145)
Sodium: 140 mmol/L (ref 135–145)
Sodium: 141 mmol/L (ref 135–145)
Sodium: 141 mmol/L (ref 135–145)
TCO2: 21 mmol/L — ABNORMAL LOW (ref 22–32)
TCO2: 21 mmol/L — ABNORMAL LOW (ref 22–32)
TCO2: 23 mmol/L (ref 22–32)
TCO2: 24 mmol/L (ref 22–32)
TCO2: 24 mmol/L (ref 22–32)
pCO2 arterial: 33.2 mmHg (ref 32.0–48.0)
pCO2 arterial: 34.7 mmHg (ref 32.0–48.0)
pCO2 arterial: 38.2 mmHg (ref 32.0–48.0)
pCO2 arterial: 42.9 mmHg (ref 32.0–48.0)
pCO2 arterial: 49.8 mmHg — ABNORMAL HIGH (ref 32.0–48.0)
pH, Arterial: 7.258 — ABNORMAL LOW (ref 7.350–7.450)
pH, Arterial: 7.314 — ABNORMAL LOW (ref 7.350–7.450)
pH, Arterial: 7.344 — ABNORMAL LOW (ref 7.350–7.450)
pH, Arterial: 7.351 (ref 7.350–7.450)
pH, Arterial: 7.359 (ref 7.350–7.450)
pO2, Arterial: 56 mmHg — ABNORMAL LOW (ref 83.0–108.0)
pO2, Arterial: 66 mmHg — ABNORMAL LOW (ref 83.0–108.0)
pO2, Arterial: 68 mmHg — ABNORMAL LOW (ref 83.0–108.0)
pO2, Arterial: 71 mmHg — ABNORMAL LOW (ref 83.0–108.0)
pO2, Arterial: 71 mmHg — ABNORMAL LOW (ref 83.0–108.0)

## 2019-05-12 LAB — BASIC METABOLIC PANEL
Anion gap: 7 (ref 5–15)
Anion gap: 8 (ref 5–15)
Anion gap: 8 (ref 5–15)
Anion gap: 9 (ref 5–15)
Anion gap: 9 (ref 5–15)
BUN: 8 mg/dL (ref 6–20)
BUN: 9 mg/dL (ref 6–20)
BUN: 11 mg/dL (ref 6–20)
BUN: 11 mg/dL (ref 6–20)
BUN: 13 mg/dL (ref 6–20)
CO2: 22 mmol/L (ref 22–32)
CO2: 22 mmol/L (ref 22–32)
CO2: 21 mmol/L — ABNORMAL LOW (ref 22–32)
CO2: 21 mmol/L — ABNORMAL LOW (ref 22–32)
CO2: 22 mmol/L (ref 22–32)
Calcium: 8.4 mg/dL — ABNORMAL LOW (ref 8.9–10.3)
Calcium: 8.6 mg/dL — ABNORMAL LOW (ref 8.9–10.3)
Calcium: 8.4 mg/dL — ABNORMAL LOW (ref 8.9–10.3)
Calcium: 8.4 mg/dL — ABNORMAL LOW (ref 8.9–10.3)
Calcium: 8.4 mg/dL — ABNORMAL LOW (ref 8.9–10.3)
Chloride: 110 mmol/L (ref 98–111)
Chloride: 109 mmol/L (ref 98–111)
Chloride: 107 mmol/L (ref 98–111)
Chloride: 109 mmol/L (ref 98–111)
Chloride: 107 mmol/L (ref 98–111)
Creatinine, Ser: 0.81 mg/dL (ref 0.61–1.24)
Creatinine, Ser: 0.99 mg/dL (ref 0.61–1.24)
Creatinine, Ser: 1.01 mg/dL (ref 0.61–1.24)
Creatinine, Ser: 1.08 mg/dL (ref 0.61–1.24)
Creatinine, Ser: 1.26 mg/dL — ABNORMAL HIGH (ref 0.61–1.24)
GFR calc Af Amer: >60 mL/min (ref >60)
GFR calc Af Amer: >60 mL/min (ref >60)
GFR calc Af Amer: >60 mL/min (ref >60)
GFR calc Af Amer: >60 mL/min (ref >60)
GFR calc Af Amer: >60 mL/min (ref >60)
GFR calc non Af Amer: >60 mL/min (ref >60)
GFR calc non Af Amer: >60 mL/min (ref >60)
GFR calc non Af Amer: >60 mL/min (ref >60)
GFR calc non Af Amer: >60 mL/min (ref >60)
GFR calc non Af Amer: >60 mL/min (ref >60)
Glucose, Bld: 121 mg/dL — ABNORMAL HIGH (ref 70–99)
Glucose, Bld: 120 mg/dL — ABNORMAL HIGH (ref 70–99)
Glucose, Bld: 113 mg/dL — ABNORMAL HIGH (ref 70–99)
Glucose, Bld: 124 mg/dL — ABNORMAL HIGH (ref 70–99)
Glucose, Bld: 144 mg/dL — ABNORMAL HIGH (ref 70–99)
Potassium: 4.1 mmol/L (ref 3.5–5.1)
Potassium: 4.3 mmol/L (ref 3.5–5.1)
Potassium: 4.1 mmol/L (ref 3.5–5.1)
Potassium: 4.3 mmol/L (ref 3.5–5.1)
Potassium: 4.5 mmol/L (ref 3.5–5.1)
Sodium: 139 mmol/L (ref 135–145)
Sodium: 139 mmol/L (ref 135–145)
Sodium: 136 mmol/L (ref 135–145)
Sodium: 139 mmol/L (ref 135–145)
Sodium: 138 mmol/L (ref 135–145)

## 2019-05-12 LAB — GLUCOSE, CAPILLARY
Glucose-Capillary: 103 mg/dL — ABNORMAL HIGH (ref 70–99)
Glucose-Capillary: 93 mg/dL (ref 70–99)
Glucose-Capillary: 106 mg/dL — ABNORMAL HIGH (ref 70–99)
Glucose-Capillary: 106 mg/dL — ABNORMAL HIGH (ref 70–99)
Glucose-Capillary: 127 mg/dL — ABNORMAL HIGH (ref 70–99)
Glucose-Capillary: 120 mg/dL — ABNORMAL HIGH (ref 70–99)
Glucose-Capillary: 132 mg/dL — ABNORMAL HIGH (ref 70–99)

## 2019-05-12 LAB — LEGIONELLA PNEUMOPHILA SEROGP 1 UR AG: L. pneumophila Serogp 1 Ur Ag: NEGATIVE

## 2019-05-12 LAB — CBC
HCT: 44.7 % (ref 39.0–52.0)
Hemoglobin: 15.1 g/dL (ref 13.0–17.0)
MCH: 29.5 pg (ref 26.0–34.0)
MCHC: 33.8 g/dL (ref 30.0–36.0)
MCV: 87.5 fL (ref 80.0–100.0)
Platelets: 248 10*3/uL (ref 150–400)
RBC: 5.11 MIL/uL (ref 4.22–5.81)
RDW: 14.5 % (ref 11.5–15.5)
WBC: 20 10*3/uL — ABNORMAL HIGH (ref 4.0–10.5)
nRBC: 0 % (ref 0.0–0.2)

## 2019-05-12 LAB — HEPATIC FUNCTION PANEL
ALT: 33 U/L (ref 0–44)
AST: 46 U/L — ABNORMAL HIGH (ref 15–41)
Albumin: 2.6 g/dL — ABNORMAL LOW (ref 3.5–5.0)
Alkaline Phosphatase: 70 U/L (ref 38–126)
Bilirubin, Direct: 0.2 mg/dL (ref 0.0–0.2)
Indirect Bilirubin: 0.5 mg/dL (ref 0.3–0.9)
Total Bilirubin: 0.7 mg/dL (ref 0.3–1.2)
Total Protein: 4.9 g/dL — ABNORMAL LOW (ref 6.5–8.1)

## 2019-05-12 LAB — LIPID PANEL
Cholesterol: 110 mg/dL (ref 0–200)
HDL: 44 mg/dL (ref >40)
LDL Cholesterol: 48 mg/dL (ref 0–99)
Total CHOL/HDL Ratio: 2.5 RATIO
Triglycerides: 89 mg/dL (ref <150)
VLDL: 18 mg/dL (ref 0–40)

## 2019-05-12 LAB — PROCALCITONIN: Procalcitonin: 0.33 ng/mL

## 2019-05-12 LAB — MAGNESIUM
Magnesium: 1.8 mg/dL (ref 1.7–2.4)
Magnesium: 2.2 mg/dL (ref 1.7–2.4)

## 2019-05-12 LAB — PHOSPHORUS
Phosphorus: 3 mg/dL (ref 2.5–4.6)
Phosphorus: 2.1 mg/dL — ABNORMAL LOW (ref 2.5–4.6)

## 2019-05-12 LAB — HEPARIN LEVEL (UNFRACTIONATED)
Heparin Unfractionated: 0.3 IU/mL (ref 0.30–0.70)
Heparin Unfractionated: 0.3 IU/mL (ref 0.30–0.70)

## 2019-05-12 MED ORDER — MIDAZOLAM HCL 2 MG/2ML IJ SOLN
0.5000 mg | INTRAMUSCULAR | Status: DC | PRN
  Administered 2019-05-12 – 2019-05-13 (×3): 1 mg via INTRAVENOUS
  Filled 2019-05-12 (×3): qty 2

## 2019-05-12 MED ORDER — ACETAMINOPHEN 325 MG PO TABS: 650.0000 mg | ORAL_TABLET | Freq: Four times a day (QID) | ORAL | Status: DC | PRN

## 2019-05-12 MED ORDER — MIDAZOLAM HCL 2 MG/2ML IJ SOLN: 0.5000 mg | INTRAMUSCULAR | Status: DC | PRN

## 2019-05-12 MED ORDER — HEPARIN SODIUM (PORCINE) 5000 UNIT/ML IJ SOLN
5000.0000 [IU] | Freq: Three times a day (TID) | INTRAMUSCULAR | Status: DC
  Administered 2019-05-12 – 2019-05-13 (×4): 5000 [IU] via SUBCUTANEOUS
  Filled 2019-05-12 (×4): qty 1

## 2019-05-12 MED ORDER — DEXMEDETOMIDINE HCL IN NACL 400 MCG/100ML IV SOLN
0.0000 ug/kg/h | INTRAVENOUS | Status: DC
  Administered 2019-05-12: 0.4 ug/kg/h via INTRAVENOUS
  Administered 2019-05-12: 0.5 ug/kg/h via INTRAVENOUS
  Filled 2019-05-12 (×2): qty 100

## 2019-05-12 MED ORDER — ACETAMINOPHEN 160 MG/5ML PO SOLN
650.0000 mg | ORAL | Status: DC | PRN
  Administered 2019-05-12: 650 mg via ORAL
  Filled 2019-05-12: qty 20.3

## 2019-05-12 MED ORDER — NOREPINEPHRINE 4 MG/250ML-% IV SOLN
2.0000 ug/min | INTRAVENOUS | Status: DC
  Administered 2019-05-12: 10 ug/min via INTRAVENOUS
  Administered 2019-05-12: 9 ug/min via INTRAVENOUS
  Filled 2019-05-12: qty 250

## 2019-05-12 MED ORDER — SODIUM CHLORIDE 0.9 % IV BOLUS
1000.0000 mL | Freq: Once | INTRAVENOUS | Status: AC
  Administered 2019-05-12: 1000 mL via INTRAVENOUS

## 2019-05-12 MED ORDER — MAGNESIUM SULFATE 2 GM/50ML IV SOLN
2.0000 g | Freq: Once | INTRAVENOUS | Status: AC
  Administered 2019-05-12: 2 g via INTRAVENOUS
  Filled 2019-05-12: qty 50

## 2019-05-12 MED ORDER — SODIUM PHOSPHATES 45 MMOLE/15ML IV SOLN
20.0000 mmol | Freq: Once | INTRAVENOUS | Status: AC
  Administered 2019-05-12: 20 mmol via INTRAVENOUS
  Filled 2019-05-12: qty 6.67

## 2019-05-12 NOTE — Procedures (Addendum)
Patient Name:Aaron Avery BBC:[id number] Epilepsy Attending:Danton Palmateer Barbra Sarks Referring Physician/Provider:Paul Heber [place], NP Duration: 05/11/2019 2233 to 05/12/2019 1233  Patient history:53 year old male status post cardiac arrest on TTM. EEG to evaluate for seizures.  Level of alertness:Comatose/sedated   AEDs during EEG study:Versed  Technical aspects: This EEG study was done with scalp electrodes positioned according to the 10-20 International system of electrode placement. Electrical activity was acquired at a sampling rate of 500Hz  and reviewed with a high frequency filter of 70Hz  and a low frequency filter of 1Hz . EEG data were recorded continuously and digitally stored.  Description: EEG showed continuous polymorphic generalized 9 to 10 Hz alpha activity. There were also intermittent generalized 2-5Hz  theta-delta slowing. EEG was reactive to tactile stimulation.  Spikes were also seen in left frontotemporal region maximum F7/T5. Hyperventilation and photic stimulation were not performed.  Abnormality -Spike, left frontotemporal, maximal F7 T5 -Intermittent slow, generalized -Generalized alpha activity  IMPRESSION: This study showed evidence of potential epileptogenicity in the left frontotemporal region.  There is also evidence of profound diffuse encephalopathy, nonspecific to etiology but could be related to sedation, anoxic brain injury.No seizures were seen throughout the recording.  EEG continues to improve compared to prior study.

## 2019-05-12 NOTE — Progress Notes (Signed)
ANTICOAGULATION CONSULT NOTE - Follow Up Consult  Pharmacy Consult for Heparin  Indication: chest pain/ACS  No Known Allergies  Patient Measurements: Height: 5\' 7"  (170.2 cm) Weight: 147 lb 12.4 oz (67 kg) IBW/kg (Calculated) : 66.1   Vital Signs: Temp: 92.7 F (33.7 C) (11/19 0100) Temp Source: Bladder (11/19 0000) BP: 103/68 (11/19 0100) Pulse Rate: 71 (11/19 0000)  Labs: Recent Labs    05/10/19 1605  05/10/19 1811  05/11/19 0113 05/11/19 0418 05/11/19 0511 05/11/19 0957 05/11/19 1747 05/12/19 0015 05/12/19 0032  HGB 15.5   < >  --    < >  --  15.6 16.0  --   --   --  15.6  HCT 47.6   < >  --    < >  --  45.5 47.0  --   --   --  46.0  PLT 341  --   --   --   --  250  --   --   --   --   --   APTT  --   --  51*  --  45*  --   --   --   --   --   --   LABPROT  --   --  15.0  --  15.0  --   --   --   --   --   --   INR  --   --  1.2  --  1.2  --   --   --   --   --   --   HEPARINUNFRC  --   --   --   --   --   --   --  <0.10* 0.33 0.30  --   CREATININE 1.49*   < > 1.22  --   --  0.96  --  0.91 0.70  --   --   TROPONINIHS 614*  --  2,928*  --   --   --   --   --   --   --   --    < > = values in this interval not displayed.    Estimated Creatinine Clearance: 102.1 mL/min (by C-G formula based on SCr of 0.7 mg/dL).   Assessment: 53 year old male S/P cardiac arrest, cath undergoing hypothermia protocol. Heparin started early this morning at 550 units/hr, 8 hours after sheath pull.  Initial heparin level (on rate of 550 units/hr) was subtherapeutic (<0.10 units/ml) this morning. Heparin infusion was increased to 850 units/hr.   Heparin level ~6 hrs after increasing heparin infusion to 850 units/hr was 0.33 units/ml, which is at the low end of the goal range for this patient. H/H, platelets stable. Per RN, no issues with IV or bleeding observed.  11/19 AM update: Heparin level therapeutic x 2, pt re-warming, will re-check sooner than normal  Goal of Therapy:   Heparin level 0.3-0.7 units/ml Monitor platelets by anticoagulation protocol: Yes   Plan:  Cont heparin at 900 units/hr Re-check heparin level at [place], PharmD, [place] Pharmacist Phone: [phone]

## 2019-05-12 NOTE — Progress Notes (Signed)
NAME:  Aaron Avery, MRN:  [id number], DOB:  Aug 23, 1965, LOS: 2 ADMISSION DATE:  05/10/2019, CONSULTATION DATE:  11/17 REFERRING MD:  Dr. Stevie Kern EDP, CHIEF COMPLAINT:  Cardiac arrest  Brief History   53 year old male admitted after cardiac arrest on 11/17   History of present illness   53 year old male with unknown past medical history who was found unresponsive in his car on 11/17.  EMS was called and upon pulling him out of the vehicle he was found to be unresponsive and pulseless.  CPR was started and he underwent 1 attempt at defibrillation.  Downtime ultimately unknown but it does appear as though resuscitation efforts were brief.  Upon arrival to the emergency department EKG demonstrated ST elevation.  He was intubated for airway protection and was taken emergently to the cardiac catheterization lab.  PCCM was asked to admit.  Sister reports he does not take good care of himself. Eats only red meat and drinks only coca-cola. He has recently had dental pain, but has not had it worked up due to financial barriers. For xseveral months he get diaphoretic and dizzy with exertion.   Past Medical History   has a past medical history of Toothache.   Significant Hospital Events   11/17: admit, LHC: no interventions required.  11/18: rewarming starting 2030   Consults:  Cardiology  Procedures:  11/17 LHC >>> 11/17 arterial line 11/17 cvc  Significant Diagnostic Tests:  CT head 11/17 > no acute EGG 11/17 > no seizure, global encephalopathy Echo 11/17: 1. Left ventricular ejection fraction, by visual estimation, is 25 to 30%. The left ventricle has severely decreased function. There is no left ventricular hypertrophy.  2. The left ventricle demonstrates global hypokinesis.  3. Global right ventricle has low normal systolic function.The right ventricular size is normal. No increase in right ventricular wall thickness.  4. Mild mitral leaflet thickening. No signifiant valvular  regurgitation/stenosis.  Micro Data:  Blood 11/17 >ngtd resp 11/17: ngtd 11/17 sars2: neg  Antimicrobials:    Interim history/subjective:  11/19: rewarming began at 2030. Nimbex off as of now and he is awakening and seemingly starting to follow commands.  11/18: hypothermia ongoing. Bloody contents from og.   Objective   Blood pressure (!) 87/63, pulse 83, temperature (!) 96.1 F (35.6 C), resp. rate 10, height 5\' 7"  (1.702 m), weight 67.7 kg, SpO2 100 %. CVP:  [2 mmHg-8 mmHg] 4 mmHg  Vent Mode: PRVC FiO2 (%):  [50 %-70 %] 60 % Set Rate:  [18 bmp] 18 bmp Vt Set:  [520 mL] 520 mL PEEP:  [7 cmH20] 7 cmH20 Plateau Pressure:  [22 cmH20-26 cmH20] 22 cmH20   Intake/Output Summary (Last 24 hours) at 05/12/2019 0749 Last data filed at 05/12/2019 0700 Gross per 24 hour  Intake 2882.94 ml  Output 820 ml  Net 2062.94 ml   Filed Weights   05/10/19 1806 05/11/19 0500 05/12/19 0500  Weight: 72.7 kg 67 kg 67.7 kg    Examination: General: thin middle aged male on vent, sedated and starting to move and track HENT: [place]/AT,  no JVD, ett in place Lungs: Clear bilateral breath sounds Cardiovascular: RRR, no MRG Abdomen: Soft, non-distended, bs diminished Extremities: No acute deformity. No edema Neuro: arousable, starting to follow commands.   Resolved Hospital Problem list     Assessment & Plan:   OOH Cardiac arrest: defribrillation done in field, so presumably this is VF/VT in the setting of STEMI given admission EKG, but no significant coronary disease on  LHC.  - Management per cardiology - heparin gtt per cards - Follow EKG - UDS (+ benzo and thc) - Echocardiogram: LVEF 25-30% HFrEF:  -follow clinically -newly found  Acute encephalopathy in the setting of cardiac arrest: at risk for anoxic injury, but evidence as well for metabolic etiology.  - CT head negative - EEG continuous per protocol - TTM 101F.... rewarming started 2030 11/17 - Fentanyl/Versed for sedation -  Nimbex to stop with rewarming -maintain normothermia at this time  Leukocytosis  - cultures: ngtd - cont holding ABX  Dental Pain:  - Monitor - Blood cultures: ngtd  Hyperglycemia with no history of DM -improving  -CBG monitoring and SSI  Bloody GI contents:  -? Ulcer vs gastritis.  -not great amount, will follow periodic h&h (with continuation of heparin gtt) - cont BID ppi,  -tolerating tf  Hypokalemia:  -resolved  Acute hypoxic resp failure:  -titrating vent.  -repeat abg in an hour  -decreased to 50% with sats 100% Best practice:  Diet:  tf Pain/Anxiety/Delirium protocol (if indicated): rewarmed, lightening rass VAP protocol (if indicated): yes DVT prophylaxis: heparin infusion GI prophylaxis: PPI Glucose control: SSI Mobility: BR Code Status: FULL Family Communication: will call 11/19 Disposition: ICU  Labs   CBC: Recent Labs  Lab 05/10/19 1605  05/11/19 0418  05/12/19 0032 05/12/19 0154 05/12/19 0351 05/12/19 0358 05/12/19 0600  WBC 15.5*  --  11.9*  --   --   --  20.0*  --   --   NEUTROABS 11.3*  --   --   --   --   --   --   --   --   HGB 15.5   < > 15.6   < > 15.6 15.3 15.1 15.3 15.3  HCT 47.6   < > 45.5   < > 46.0 45.0 44.7 45.0 45.0  MCV 89.5  --  86.3  --   --   --  87.5  --   --   PLT 341  --  250  --   --   --  248  --   --    < > = values in this interval not displayed.    Basic Metabolic Panel: Recent Labs  Lab 05/10/19 1605  05/10/19 1701 05/10/19 1811  05/11/19 0418  05/11/19 0957  05/11/19 1747 05/12/19 0032 05/12/19 0154 05/12/19 0351 05/12/19 0358 05/12/19 0600  NA 141   < > 139 139   < > 139   < > 140  --  139 141 141  --  140 140  K 2.8*   < > 3.9 3.8   < > 3.1*   < > 4.3   < > 3.6 3.9 4.0  --  4.0 4.0  CL 105   < > 103 106  --  109  --  109  --  110  --   --   --   --   --   CO2 19*  --   --  22  --  20*  --  21*  --  20*  --   --   --   --   --   GLUCOSE 174*   < > 170* 167*  --  128*  --  119*  --  118*  --   --    --   --   --   BUN 6   < > 6 7  --  7  --  7  --  9  --   --   --   --   --   CREATININE 1.49*   < > 1.20 1.22  --  0.96  --  0.91  --  0.70  --   --   --   --   --   CALCIUM 8.7*  --   --  8.5*  --  8.4*  --  8.7*  --  8.3*  --   --   --   --   --   MG  --   --   --   --   --  2.1  --  2.1  --  2.0  --   --  1.8  --   --   PHOS  --   --   --   --   --  1.9*  --  2.2*  --  2.3*  --   --  3.0  --   --    < > = values in this interval not displayed.   GFR: Estimated Creatinine Clearance: 102.1 mL/min (by C-G formula based on SCr of 0.7 mg/dL). Recent Labs  Lab 05/10/19 1605 05/10/19 1606 05/10/19 1811 05/11/19 0418 05/12/19 0351  PROCALCITON  --   --   --  0.20 0.33  WBC 15.5*  --   --  11.9* 20.0*  LATICACIDVEN  --  3.9* 1.9  --   --     Liver Function Tests: No results for input(s): AST, ALT, ALKPHOS, BILITOT, PROT, ALBUMIN in the last 168 hours. No results for input(s): LIPASE, AMYLASE in the last 168 hours. No results for input(s): AMMONIA in the last 168 hours.  ABG    Component Value Date/Time   PHART 7.314 (L) 05/12/2019 0600   PCO2ART 42.9 05/12/2019 0600   PO2ART 66.0 (L) 05/12/2019 0600   HCO3 22.2 05/12/2019 0600   TCO2 24 05/12/2019 0600   ACIDBASEDEF 5.0 (H) 05/12/2019 0600   O2SAT 93.0 05/12/2019 0600     Coagulation Profile: Recent Labs  Lab 05/10/19 1811 05/11/19 0113  INR 1.2 1.2    Cardiac Enzymes: No results for input(s): CKTOTAL, CKMB, CKMBINDEX, TROPONINI in the last 168 hours.  HbA1C: Hgb A1c MFr Bld  Date/Time Value Ref Range Status  05/10/2019 06:11 PM 6.0 (H) 4.8 - 5.6 % Final    Comment:    (NOTE) Pre diabetes:          5.7%-6.4% Diabetes:              >6.4% Glycemic control for   <7.0% adults with diabetes     CBG: Recent Labs  Lab 05/11/19 1653 05/11/19 1924 05/11/19 2115 05/12/19 0020 05/12/19 0355  GLUCAP 110* 107* 96 93 103*          Critical care time: The patient is critically ill with multiple organ systems  failure and requires high complexity decision making for assessment and support, frequent evaluation and titration of therapies, application of advanced monitoring technologies and extensive interpretation of multiple databases.  Critical care time 41 mins. This represents my time independent of the NPs time taking care of the pt. This is excluding procedures.    Audria Nine DO After hours pager: [phone]  [facility] 05/12/2019, 7:49 AM

## 2019-05-12 NOTE — Progress Notes (Signed)
Patient's core temp on Arctic sun was 34.6, but oral temp was 97.8 (36.6). Water temp 42.4. Dr. Ruthann Cancer notified and stated it was ok to remove cooling pads as patient was awake and following commands.   Patient with only 266 ml UOP from foley this shift. Bladder scan 0 mls. Dr. Ruthann Cancer notified and ordered NS bolus over 3 hours. Will notify night shift to monitor for increasing UOP.  Joellen Jersey, RN

## 2019-05-12 NOTE — Significant Event (Signed)
Patient pulled out CVC. Currently on 12 mcg/min levophed, also receiving fluid bolus over 3 hours per day team. Currently with 2 working PIV. Have communicated with nurse that if pressor requirements increase will place new CVC. Will evaluate BP after fluid bolus.

## 2019-05-12 NOTE — Progress Notes (Signed)
ANTICOAGULATION CONSULT NOTE - Follow Up Consult  Pharmacy Consult for Heparin  Indication: chest pain/ACS  No Known Allergies  Patient Measurements: Height: 5\' 7"  (170.2 cm) Weight: 149 lb 5.1 oz (67.7 kg) IBW/kg (Calculated) : 66.1   Vital Signs: Temp: 96.6 F (35.9 C) (11/19 0800) Temp Source: Bladder (11/19 0800) BP: 95/65 (11/19 0800) Pulse Rate: 83 (11/19 0400)  Labs: Recent Labs    05/10/19 1605  05/10/19 1811  05/11/19 0113 05/11/19 0418  05/11/19 0957 05/11/19 1747 05/12/19 0015  05/12/19 0351 05/12/19 0358 05/12/19 0600 05/12/19 0800 05/12/19 0910 05/12/19 1016  HGB 15.5   < >  --    < >  --  15.6   < >  --   --   --    < > 15.1 15.3 15.3  --   --  16.0  HCT 47.6   < >  --    < >  --  45.5   < >  --   --   --    < > 44.7 45.0 45.0  --   --  47.0  PLT 341  --   --   --   --  250  --   --   --   --   --  248  --   --   --   --   --   APTT  --   --  51*  --  45*  --   --   --   --   --   --   --   --   --   --   --   --   LABPROT  --   --  15.0  --  15.0  --   --   --   --   --   --   --   --   --   --   --   --   INR  --   --  1.2  --  1.2  --   --   --   --   --   --   --   --   --   --   --   --   HEPARINUNFRC  --   --   --   --   --   --    < > <0.10* 0.33 0.30  --   --   --   --   --  0.30  --   CREATININE 1.49*   < > 1.22  --   --  0.96  --  0.91 0.70  --   --   --   --   --  0.81  --   --   TROPONINIHS 614*  --  2,928*  --   --   --   --   --   --   --   --   --   --   --   --   --   --    < > = values in this interval not displayed.    Estimated Creatinine Clearance: 100.9 mL/min (by C-G formula based on SCr of 0.81 mg/dL).   Assessment: 53 year old male S/P cardiac arrest, cath undergoing hypothermia protocol.   Patient underwent code cool, now finishing rewarming. Heparin level came back therapeutic at 0.3, on 900 units/hr. Hgb 15.3, plt 248. No s/sx of bleeding or infusion issues.   Goal of Therapy:  Heparin level 0.3-0.7 units/ml Monitor  platelets by anticoagulation protocol: Yes   Plan:  Increase heparin infusion to 950 units/hr to keep in goal range. Monitor daily HL, CBC, and for s/sx of bleeding.  Sherron Monday, PharmD, BCCCP Clinical Pharmacist  Phone: [phone]  Please check AMION for all [facility] phone numbers After 10:00 PM, call Main Pharmacy [phone]

## 2019-05-12 NOTE — Progress Notes (Signed)
Subjective:  Patient is intubated and on Arctic sun protocol.  His sister is present at the bedside.  Intake/Output from previous day:  I/O last 3 completed shifts: In: 4233.8 [I.V.:2260.2; Other:20; NG/GT:571.3; IV Piggyback:1382.2] Out: 4401 [UUVOZ:3664; Emesis/NG output:350] Total I/O In: 403.1 [I.V.:230.8; NG/GT:171.2; IV Piggyback:1.2] Out: 145 [Urine:145]  Blood pressure (!) 81/56, pulse 83, temperature (!) 96.6 F (35.9 C), temperature source Bladder, resp. rate 10, height 5' 7"  (1.702 m), weight 67.7 kg, SpO2 100 %. Physical Exam  Constitutional: He appears well-developed and well-nourished. He is intubated.  HENT:  Head: Atraumatic.  Eyes: Conjunctivae are normal.  Neck: Neck supple. No thyromegaly present.  Cardiovascular: Normal rate, regular rhythm and intact distal pulses. Exam reveals distant heart sounds. Exam reveals no gallop.  No murmur heard. Pulses:      Carotid pulses are 2+ on the right side and 2+ on the left side.      Dorsalis pedis pulses are 0 on the right side and 0 on the left side.       Posterior tibial pulses are 0 on the right side and 0 on the left side.  No leg edema, no JVD.  Pulmonary/Chest: Breath sounds normal. He is intubated.  Abdominal: Soft. Bowel sounds are normal.  Musculoskeletal: Normal range of motion.  Neurological: He is alert.  Skin: Skin is warm and dry.  Psychiatric: He has a normal mood and affect.    Lab Results: BMP BNP (last 3 results) No results for input(s): BNP in the last 8760 hours.  ProBNP (last 3 results) No results for input(s): PROBNP in the last 8760 hours. BMP Latest Ref Rng & Units 05/12/2019 05/12/2019 05/12/2019  Glucose 70 - 99 mg/dL - 120(H) 121(H)  BUN 6 - 20 mg/dL - 9 8  Creatinine 0.61 - 1.24 mg/dL - 0.99 0.81  Sodium 135 - 145 mmol/L 140 139 139  Potassium 3.5 - 5.1 mmol/L 4.3 4.3 4.1  Chloride 98 - 111 mmol/L - 109 110  CO2 22 - 32 mmol/L - 22 22  Calcium 8.9 - 10.3 mg/dL - 8.6(L) 8.4(L)    No flowsheet data found. CBC Latest Ref Rng & Units 05/12/2019 05/12/2019 05/12/2019  WBC 4.0 - 10.5 K/uL - - -  Hemoglobin 13.0 - 17.0 g/dL 16.0 15.3 15.3  Hematocrit 39.0 - 52.0 % 47.0 45.0 45.0  Platelets 150 - 400 K/uL - - -   Lipid Panel  No results found for: CHOL, TRIG, HDL, CHOLHDL, VLDL, LDLCALC, LDLDIRECT Cardiac Panel (last 3 results) No results for input(s): CKTOTAL, CKMB, TROPONINI, RELINDX in the last 72 hours.  HEMOGLOBIN A1C Lab Results  Component Value Date   HGBA1C 6.0 (H) 05/10/2019   MPG 125.5 05/10/2019   TSH No results for input(s): TSH in the last 8760 hours. Imaging: Dg Chest 1 View  Result Date: 05/10/2019 CLINICAL DATA:  53 year old male central line placement. EXAM: CHEST  1 VIEW COMPARISON:  1613 hours today. FINDINGS: Portable AP semi upright view at 2112 hours. Right IJ approach central line in place, tip is about 1 vertebral body below the carina. No pneumothorax. Stable cardiac size and mediastinal contours. Stable endotracheal tube tip in good position. Enteric tube side hole is at the level of the distal esophagus. Stable lung volumes and ventilation. Pacer or resuscitation pads project over the chest. Negative visible bowel gas pattern. IMPRESSION: 1. Right IJ central line placed, tip at the lower SVC level. No pneumothorax. 2. Enteric tube side hole at the level of the  distal esophagus. Advanced 6 cm to ensure side hole placement within the stomach. 3. Stable ventilation. Electronically Signed   By: Genevie Ann M.D.   On: 05/10/2019 21:28   Ct Head Wo Contrast  Result Date: 05/10/2019 CLINICAL DATA:  53 year old male status post cardiac arrest today. Intubated. EXAM: CT HEAD WITHOUT CONTRAST TECHNIQUE: Contiguous axial images were obtained from the base of the skull through the vertex without intravenous contrast. COMPARISON:  None. FINDINGS: Brain: Normal cisterns and sulci at this time. Gray-white matter differentiation is within normal limits  throughout the brain. No midline shift, ventriculomegaly, mass effect, evidence of mass lesion, intracranial hemorrhage or evidence of cortically based acute infarction. Vascular: Questionable trace intravascular contrast. No suspicious intracranial vascular hyperdensity. Skull: No acute osseous abnormality identified. Sinuses/Orbits: Fluid in the visible pharynx. Visualized paranasal sinuses and mastoids are well pneumatized. Other: Visualized orbits and scalp soft tissues are within normal limits. IMPRESSION: Normal noncontrast CT appearance of the brain at this time. Electronically Signed   By: Genevie Ann M.D.   On: 05/10/2019 19:59   Dg Chest Port 1 View  Result Date: 05/10/2019 CLINICAL DATA:  Intubation EXAM: PORTABLE CHEST 1 VIEW COMPARISON:  None. FINDINGS: Endotracheal tube tip is at or just above the carina. Esophageal tube tip below the diaphragm but incompletely visualized. No focal airspace opacity, pleural effusion or pneumothorax. Normal heart size. IMPRESSION: 1. Endotracheal tube tip at or just above the carina 2. Esophageal tube tip below the diaphragm but incompletely visualized 3. The lungs are grossly clear Electronically Signed   By: Donavan Foil M.D.   On: 05/10/2019 16:29    Cardiac Studies: Echocardiogram 05/11/2019:  1. Left ventricular ejection fraction, by visual estimation, is 25 to 30%. The left ventricle has severely decreased function. There is no left ventricular hypertrophy.  2. The left ventricle demonstrates global hypokinesis.  3. Global right ventricle has low normal systolic function.The right ventricular size is normal. No increase in right ventricular wall thickness.  4. Mild mitral leaflet thickening. No signifiant valvular regurgitation/stenosis.  Left heart catheterization 05/11/2019: Normal coronary arteries.  EF 35 to 40% with global hypokinesis.  Mild elevation in LVEDP at 20 mmHg.  EKG 05/11/2019: Marked sinus bradycardia at rate of 49 bpm, prolonged QT  interval.  Early repolarization.  Compared to 05/10/2019, sinus tachycardia at rate of 104 bpm with ST elevation in the inferior and lateral leads not present.  ST depression in anterior leads suggestive of posterior STEMI not present.  Scheduled Meds: . artificial tears  1 application Both Eyes I3K  . chlorhexidine gluconate (MEDLINE KIT)  15 mL Mouth Rinse BID  . Chlorhexidine Gluconate Cloth  6 each Topical Daily  . feeding supplement (PRO-STAT SUGAR FREE 64)  30 mL Per Tube Daily  . heparin injection (subcutaneous)  5,000 Units Subcutaneous Q8H  . insulin aspart  0-15 Units Subcutaneous Q4H  . mouth rinse  15 mL Mouth Rinse 10 times per day  . pantoprazole (PROTONIX) IV  40 mg Intravenous Q12H  . sodium chloride flush  10-40 mL Intracatheter Q12H  . sodium chloride flush  3 mL Intravenous Q12H   Continuous Infusions: . sodium chloride    . sodium chloride 10 mL/hr at 05/12/19 1100  . cisatracurium (NIMBEX) infusion Stopped (05/12/19 0820)  . dexmedetomidine (PRECEDEX) IV infusion 0.6 mcg/kg/hr (05/12/19 1100)  . feeding supplement (VITAL AF 1.2 CAL) 50 mL/hr at 05/12/19 1100  . fentaNYL infusion INTRAVENOUS 300 mcg/hr (05/12/19 1100)  . midazolam 2 mg/hr (05/12/19  1100)  . norepinephrine (LEVOPHED) Adult infusion    . propofol (DIPRIVAN) infusion Stopped (05/10/19 2051)   PRN Meds:.sodium chloride, acetaminophen (TYLENOL) oral liquid 160 mg/5 mL **OR** acetaminophen, [COMPLETED] cisatracurium **AND** cisatracurium (NIMBEX) infusion **AND** cisatracurium, fentaNYL, midazolam, ondansetron (ZOFRAN) IV, sodium chloride flush, sodium chloride flush  Assessment/Plan:  1.  Witnessed cardiac arrest, presently patient in induced coma with hypothermia protocol. 2.  Nonischemic cardiomyopathy with severe LV systolic dysfunction 3.  Hypotension probably related to hypothermia, being supported by pressors  Recommendation: Continue present medical therapy for now with supportive care.  It  appears that he is responding to decrease sedation, will reevaluate once he is warmed up.  Check his lipids, may be erroneously low in view of acute illness.  He also needs TSH.  Needs CMP to evaluate LFTs as well.   Adrian Prows, M.D. 05/12/2019, 12:38 PM [facility] [place] Pager: [phone] Office: [phone] If no answer: [phone]

## 2019-05-12 NOTE — Progress Notes (Signed)
vLTM EEG complete. Skin breakdown (scab) noted on forehead electrode site FP2. Let nursing know.

## 2019-05-12 NOTE — Progress Notes (Signed)
Parachute Progress Note Patient Name: Aaron Avery DOB: [birth year] MRN: [id number]   Date of Service  05/12/2019  HPI/Events of Note  Delirium - Patient pulled out CVL. On Norepinephrine IV infusion. Qtc interval = 0.5 seconds and HR = 59. Therefore, reluctant to use Haldol or Precedex.   eICU Interventions  Will order: 1. Versed 0.5mg  to 1 mg IV Q 2 hours PRN agitation. 2. Ground team notified about possible CVL replacement.      Intervention Category Major Interventions: Delirium, psychosis, severe agitation - evaluation and management  [name] 05/12/2019, 7:29 PM

## 2019-05-12 NOTE — Procedures (Signed)
Extubation Procedure Note  Patient Details:   Name: Aaron Avery DOB: 1965-10-03 MRN: [id number]   Airway Documentation:    Vent end date: 05/12/19 Vent end time: 9798   Evaluation  O2 sats: stable throughout Complications: No apparent complications Patient did tolerate procedure well. Bilateral Breath Sounds: Clear   Yes, pt could speak post-extubation.  Pt extubated by Dr. Ruthann Cancer.  Pt tolerated procedure well with no apparent complications. Earney Navy 05/12/2019, 5:17 PM

## 2019-05-12 NOTE — Progress Notes (Signed)
Pt sedation being weaned for exutbation. Tolerated short trial is still a bit drowsy but cont to put himself in harms way with attempts to self extubate. Decision made to extubate at this time.   Able to follow commands/tongue thrust and sustained head lift.  Good cough.   Family at bedside.

## 2019-05-13 ENCOUNTER — Encounter (HOSPITAL_COMMUNITY): Payer: Self-pay | Admitting: *Deleted

## 2019-05-13 ENCOUNTER — Other Ambulatory Visit: Payer: Self-pay

## 2019-05-13 ENCOUNTER — Inpatient Hospital Stay (HOSPITAL_COMMUNITY): Payer: Self-pay

## 2019-05-13 DIAGNOSIS — I469 Cardiac arrest, cause unspecified: Secondary | ICD-10-CM

## 2019-05-13 DIAGNOSIS — E440 Moderate protein-calorie malnutrition: Secondary | ICD-10-CM | POA: Insufficient documentation

## 2019-05-13 DIAGNOSIS — I428 Other cardiomyopathies: Secondary | ICD-10-CM

## 2019-05-13 DIAGNOSIS — I4901 Ventricular fibrillation: Secondary | ICD-10-CM

## 2019-05-13 LAB — CORTISOL: Cortisol, Plasma: 29.3 ug/dL

## 2019-05-13 LAB — HEPATIC FUNCTION PANEL
ALT: 30 U/L (ref 0–44)
AST: 45 U/L — ABNORMAL HIGH (ref 15–41)
Albumin: 2.9 g/dL — ABNORMAL LOW (ref 3.5–5.0)
Alkaline Phosphatase: 78 U/L (ref 38–126)
Bilirubin, Direct: 0.2 mg/dL (ref 0.0–0.2)
Indirect Bilirubin: 0.6 mg/dL (ref 0.3–0.9)
Total Bilirubin: 0.8 mg/dL (ref 0.3–1.2)
Total Protein: 5.4 g/dL — ABNORMAL LOW (ref 6.5–8.1)

## 2019-05-13 LAB — BASIC METABOLIC PANEL
Anion gap: 13 (ref 5–15)
BUN: 14 mg/dL (ref 6–20)
CO2: 21 mmol/L — ABNORMAL LOW (ref 22–32)
Calcium: 8.6 mg/dL — ABNORMAL LOW (ref 8.9–10.3)
Chloride: 108 mmol/L (ref 98–111)
Creatinine, Ser: 1.18 mg/dL (ref 0.61–1.24)
GFR calc Af Amer: >60 mL/min (ref >60)
GFR calc non Af Amer: >60 mL/min (ref >60)
Glucose, Bld: 67 mg/dL — ABNORMAL LOW (ref 70–99)
Potassium: 3.9 mmol/L (ref 3.5–5.1)
Sodium: 142 mmol/L (ref 135–145)

## 2019-05-13 LAB — CBC
HCT: 39.7 % (ref 39.0–52.0)
Hemoglobin: 12.8 g/dL — ABNORMAL LOW (ref 13.0–17.0)
MCH: 29.4 pg (ref 26.0–34.0)
MCHC: 32.2 g/dL (ref 30.0–36.0)
MCV: 91.3 fL (ref 80.0–100.0)
Platelets: 216 10*3/uL (ref 150–400)
RBC: 4.35 MIL/uL (ref 4.22–5.81)
RDW: 14.8 % (ref 11.5–15.5)
WBC: 20.4 10*3/uL — ABNORMAL HIGH (ref 4.0–10.5)
nRBC: 0 % (ref 0.0–0.2)

## 2019-05-13 LAB — CULTURE, RESPIRATORY W GRAM STAIN

## 2019-05-13 LAB — GLUCOSE, CAPILLARY
Glucose-Capillary: 112 mg/dL — ABNORMAL HIGH (ref 70–99)
Glucose-Capillary: 122 mg/dL — ABNORMAL HIGH (ref 70–99)
Glucose-Capillary: 114 mg/dL — ABNORMAL HIGH (ref 70–99)
Glucose-Capillary: 107 mg/dL — ABNORMAL HIGH (ref 70–99)
Glucose-Capillary: 86 mg/dL (ref 70–99)

## 2019-05-13 LAB — PROCALCITONIN: Procalcitonin: 0.45 ng/mL

## 2019-05-13 LAB — TSH: TSH: 4.777 u[IU]/mL — ABNORMAL HIGH (ref 0.350–4.500)

## 2019-05-13 LAB — PHOSPHORUS: Phosphorus: 2.6 mg/dL (ref 2.5–4.6)

## 2019-05-13 LAB — MAGNESIUM: Magnesium: 2.2 mg/dL (ref 1.7–2.4)

## 2019-05-13 MED ORDER — ASPIRIN 81 MG PO CHEW
81.0000 mg | CHEWABLE_TABLET | Freq: Once | ORAL | Status: AC
  Administered 2019-05-13: 81 mg via ORAL
  Filled 2019-05-13: qty 1

## 2019-05-13 MED ORDER — ALBUTEROL SULFATE HFA 108 (90 BASE) MCG/ACT IN AERS
1.0000 | INHALATION_SPRAY | RESPIRATORY_TRACT | Status: DC | PRN
  Filled 2019-05-13: qty 6.7

## 2019-05-13 MED ORDER — GADOBUTROL 1 MMOL/ML IV SOLN
8.0000 mL | Freq: Once | INTRAVENOUS | Status: AC | PRN
  Administered 2019-05-13: 8 mL via INTRAVENOUS

## 2019-05-13 MED ORDER — ALPRAZOLAM 0.25 MG PO TABS
0.2500 mg | ORAL_TABLET | Freq: Two times a day (BID) | ORAL | Status: DC | PRN
  Administered 2019-05-13 – 2019-05-14 (×2): 0.25 mg via ORAL
  Filled 2019-05-13 (×2): qty 1

## 2019-05-13 MED ORDER — UMECLIDINIUM BROMIDE 62.5 MCG/INH IN AEPB
1.0000 | INHALATION_SPRAY | Freq: Every day | RESPIRATORY_TRACT | Status: DC
  Administered 2019-05-14: 1 via RESPIRATORY_TRACT
  Filled 2019-05-13: qty 7

## 2019-05-13 MED ORDER — ENSURE ENLIVE PO LIQD: 237.0000 mL | Freq: Three times a day (TID) | ORAL | Status: DC

## 2019-05-13 MED ORDER — INFLUENZA VAC SPLIT QUAD 0.5 ML IM SUSY
0.5000 mL | PREFILLED_SYRINGE | INTRAMUSCULAR | Status: AC
  Administered 2019-05-14: 0.5 mL via INTRAMUSCULAR
  Filled 2019-05-13: qty 0.5

## 2019-05-13 MED ORDER — SODIUM CHLORIDE 0.9 % IV SOLN
3.0000 g | Freq: Four times a day (QID) | INTRAVENOUS | Status: DC
  Administered 2019-05-13 – 2019-05-14 (×4): 3 g via INTRAVENOUS
  Filled 2019-05-13 (×2): qty 3
  Filled 2019-05-13 (×2): qty 8
  Filled 2019-05-13 (×2): qty 3

## 2019-05-13 MED ORDER — PNEUMOCOCCAL VAC POLYVALENT 25 MCG/0.5ML IJ INJ: 0.5000 mL | INJECTION | INTRAMUSCULAR | Status: DC

## 2019-05-13 MED ORDER — ADULT MULTIVITAMIN W/MINERALS CH
1.0000 | ORAL_TABLET | Freq: Every day | ORAL | Status: DC
  Filled 2019-05-13: qty 1

## 2019-05-13 NOTE — Progress Notes (Addendum)
NAME:  Aaron Avery, MRN:  [id number], DOB:  [birth year], LOS: 3 ADMISSION DATE:  05/10/2019, CONSULTATION DATE:  11/17 REFERRING MD:  Dr. Stevie Kern EDP, CHIEF COMPLAINT:  Cardiac arrest  Brief History   53 year old male admitted after cardiac arrest on 11/17   History of present illness   53 year old male with unknown past medical history who was found unresponsive in his car on 11/17.  EMS was called and upon pulling him out of the vehicle he was found to be unresponsive and pulseless.  CPR was started and he underwent 1 attempt at defibrillation.  Downtime ultimately unknown but it does appear as though resuscitation efforts were brief.  Upon arrival to the emergency department EKG demonstrated ST elevation.  He was intubated for airway protection and was taken emergently to the cardiac catheterization lab.  PCCM was asked to admit.  Sister reports he does not take good care of himself. Eats only red meat and drinks only coca-cola. He has recently had dental pain, but has not had it worked up due to financial barriers. For xseveral months he get diaphoretic and dizzy with exertion.   Past Medical History   has a past medical history of Toothache.   Significant Hospital Events   11/17: admit, LHC: no interventions required.  11/18: rewarming starting 2030 11/20: off pressors. HDS   Consults:  Cardiology  Procedures:  11/17 LHC >>> 11/17 arterial line 11/17 cvc-11/20   Significant Diagnostic Tests:  CT head 11/17 > no acute EGG 11/17 > no seizure, global encephalopathy Echo 11/17: 1. Left ventricular ejection fraction, by visual estimation, is 25 to 30%. The left ventricle has severely decreased function. There is no left ventricular hypertrophy.  2. The left ventricle demonstrates global hypokinesis.  3. Global right ventricle has low normal systolic function.The right ventricular size is normal. No increase in right ventricular wall thickness.  4. Mild mitral leaflet  thickening. No signifiant valvular regurgitation/stenosis.  Micro Data:  Blood 11/17 >ngtd resp 11/17: Moderate GPC 11/17 sars2: neg  Antimicrobials:  Unasyn   Interim history/subjective:  11/19: rewarming began at 2030. Nimbex off as of now and he is awakening and seemingly starting to follow commands.  11/18: hypothermia ongoing. Bloody contents from og.  11/20 weaned off of NE   Objective   Blood pressure 114/64, pulse 97, temperature 98.3 F (36.8 C), temperature source Oral, resp. rate 17, height 5\' 7"  (1.702 m), weight 67.7 kg, SpO2 (!) 89 %. CVP:  [6 mmHg] 6 mmHg  Vent Mode: CPAP;PSV FiO2 (%):  [36 %-40 %] 36 % Set Rate:  [18 bmp] 18 bmp Vt Set:  [520 mL] 520 mL PEEP:  [7 cmH20] 7 cmH20 Pressure Support:  [5 cmH20] 5 cmH20 Plateau Pressure:  [20 cmH20] 20 cmH20   Intake/Output Summary (Last 24 hours) at 05/13/2019 0933 Last data filed at 05/13/2019 0600 Gross per 24 hour  Intake 2709.6 ml  Output 1066 ml  Net 1643.6 ml   Filed Weights   05/10/19 1806 05/11/19 0500 05/12/19 0500  Weight: 72.7 kg 67 kg 67.7 kg    Examination:  General: WNWD adult M reclined in bed NAD  HENT: NCAT. Anicteric sclera. Pink mmm  Lungs: CTA with RLL mildly diminished. No accessory use Cardiovascular: RRR s1s2 no rgm Capr efill < 3 seconds  Abdomen: soft round ntnd Extremities: Symmetrical bulk and tone no acute deformity, No cyanosis or clubbing Neuro: AAOx3, following commands. [facility]  Resolved Hospital Problem list   Acute hypoxic respiratory failure  Shock  Hypokalemia   Assessment & Plan:   S/p Cardiac arrest - defribrillation done in field, so presumably this is VF/VT in the setting of STEMI given admission EKG, but no significant coronary disease on LHC.  - heparin gtt per cards -DAPT rec by cardiology, after ICD placement -EP consult and cardiac MRI per cardiology  HFrEF:  - Echocardiogram: LVEF 25-30% P -Cardiology primary, plan to repeat ECHO prior to discharge   Acute encephalopathy, improving - in the setting of cardiac arrest: at risk for anoxic injury, but evidence as well for metabolic etiology.  -sp TTM - CT head negative P -delirium precautions   Leukocytosis  -stable x several days. Reactive vs PNA (scant tan sputum production) - Tracheal aspirate with moderate GPC  P - Will start empiric abx for ? Aspiration given history - AM CXR. PCT.  -Trend WBC and follow micro data. Dc abx when appropriate  Dental Pain:  - Monitor for acute changes - Will need OP follow up   Hyperglycemia with no history of DM -improving  -CBG monitoring and SSI  Bloody GI contents:  -? Ulcer vs gastritis.  P -continue BID PPI     Best practice:  Diet:  NPO for procedure  Pain/Anxiety/Delirium protocol (if indicated): na VAP protocol (if indicated): na DVT prophylaxis: heparin infusion GI prophylaxis: PPI Glucose control: SSI Mobility: BR Code Status: FULL Family Communication: patient updated 11/20 Disposition: Transferring from ICU to stepdown. PCCM will plan to sign off after 11/20. Cardiology is primary  Labs   CBC: Recent Labs  Lab 05/10/19 1605  05/11/19 0418  05/12/19 0351 05/12/19 0358 05/12/19 0600 05/12/19 1016 05/13/19 0201  WBC 15.5*  --  11.9*  --  20.0*  --   --   --  20.4*  NEUTROABS 11.3*  --   --   --   --   --   --   --   --   HGB 15.5   < > 15.6   < > 15.1 15.3 15.3 16.0 12.8*  HCT 47.6   < > 45.5   < > 44.7 45.0 45.0 47.0 39.7  MCV 89.5  --  86.3  --  87.5  --   --   --  91.3  PLT 341  --  250  --  248  --   --   --  216   < > = values in this interval not displayed.    Basic Metabolic Panel: Recent Labs  Lab 05/11/19 0957  05/11/19 1747  05/12/19 0351  05/12/19 1000 05/12/19 1016 05/12/19 1215 05/12/19 1409 05/12/19 1804 05/13/19 0201  NA 140  --  139   < >  --    < > 139 140 136 139 138 142  K 4.3   < > 3.6   < >  --    < > 4.3 4.3 4.1 4.3 4.5 3.9  CL 109  --  110  --   --    < > 109  --  107 109  107 108  CO2 21*  --  20*  --   --    < > 22  --  21* 21* 22 21*  GLUCOSE 119*  --  118*  --   --    < > 120*  --  113* 124* 144* 67*  BUN 7  --  9  --   --    < > 9  --  11 11 13  14  CREATININE 0.91  --  0.70  --   --    < > 0.99  --  1.01 1.08 1.26* 1.18  CALCIUM 8.7*  --  8.3*  --   --    < > 8.6*  --  8.4* 8.4* 8.4* 8.6*  MG 2.1  --  2.0  --  1.8  --   --   --   --   --  2.2 2.2  PHOS 2.2*  --  2.3*  --  3.0  --   --   --   --   --  2.1* 2.6   < > = values in this interval not displayed.   GFR: Estimated Creatinine Clearance: 69.2 mL/min (by C-G formula based on SCr of 1.18 mg/dL). Recent Labs  Lab 05/10/19 1605 05/10/19 1606 05/10/19 1811 05/11/19 0418 05/12/19 0351 05/13/19 0201  PROCALCITON  --   --   --  0.20 0.33  --   WBC 15.5*  --   --  11.9* 20.0* 20.4*  LATICACIDVEN  --  3.9* 1.9  --   --   --     Liver Function Tests: Recent Labs  Lab 05/12/19 1409 05/13/19 0201  AST 46* 45*  ALT 33 30  ALKPHOS 70 78  BILITOT 0.7 0.8  PROT 4.9* 5.4*  ALBUMIN 2.6* 2.9*   No results for input(s): LIPASE, AMYLASE in the last 168 hours. No results for input(s): AMMONIA in the last 168 hours.  ABG    Component Value Date/Time   PHART 7.258 (L) 05/12/2019 1016   PCO2ART 49.8 (H) 05/12/2019 1016   PO2ART 71.0 (L) 05/12/2019 1016   HCO3 22.2 05/12/2019 1016   TCO2 24 05/12/2019 1016   ACIDBASEDEF 5.0 (H) 05/12/2019 1016   O2SAT 91.0 05/12/2019 1016     Coagulation Profile: Recent Labs  Lab 05/10/19 1811 05/11/19 0113  INR 1.2 1.2    Cardiac Enzymes: No results for input(s): CKTOTAL, CKMB, CKMBINDEX, TROPONINI in the last 168 hours.  HbA1C: Hgb A1c MFr Bld  Date/Time Value Ref Range Status  05/10/2019 06:11 PM 6.0 (H) 4.8 - 5.6 % Final    Comment:    (NOTE) Pre diabetes:          5.7%-6.4% Diabetes:              >6.4% Glycemic control for   <7.0% adults with diabetes     CBG: Recent Labs  Lab 05/12/19 1647 05/12/19 2000 05/12/19 2302 05/13/19 0303  05/13/19 0732  GLUCAP 127* 120* 132* [place] MSN, AGACNP-BC [place] [phone] If no answer, [phone] 05/13/2019, 10:07 AM

## 2019-05-13 NOTE — Progress Notes (Signed)
Nutrition Follow up   DOCUMENTATION CODES:   Non-severe (moderate) malnutrition in context of social or environmental circumstances  INTERVENTION:    Ensure Enlive po TID, each supplement provides 350 kcal and 20 grams of protein  MVI daily   NUTRITION DIAGNOSIS:   Moderate Malnutrition related to social / environmental circumstances(long hx of dental issues, financial issues) as evidenced by moderate fat depletion, mild muscle depletion.  Ongoing  GOAL:   Patient will meet greater than or equal to 90% of their needs   Diet advanced- monitor PO    MONITOR:   Vent status, Labs, Weight trends, TF tolerance  REASON FOR ASSESSMENT:   Consult, Ventilator Enteral/tube feeding initiation and management  ASSESSMENT:   53 yo male admitted after being found unresponsive in his car and admitted post cardiac arrest on 11/17. PMH unknown at time of admission, hx of toothache   11/19- extubated   RD working remotely. Off pressors. Plan ICD placement. Diet advanced to heart healthy this afternoon.   Spoke with pt via phone. Denies loss of appetite PTA. States he typically eats two meals daily that consist of eggs and pizza. Denies tooth pain prevents him from eating. Endorses a UBW of 160 lb and denies recent weight loss. Pt willing to try Ensure.   Admission weight: 72.7 kg  Current weight: 67.7 kg (suspect recent wt loss)  I/O: +3,630 ml since admit UOP: 1,161 ml x 24 hrs   Drips: abx Medications: SS novolog Labs: CBG 67-132  Diet Order:   Diet Order            Diet Heart Room service appropriate? Yes; Fluid consistency: Thin  Diet effective now              EDUCATION NEEDS:   Not appropriate for education at this time  Skin:  Skin Assessment: Reviewed RN Assessment  Last BM:  11/18  Height:   Ht Readings from Last 1 Encounters:  05/10/19 5\' 7"  (1.702 m)    Weight:   Wt Readings from Last 1 Encounters:  05/12/19 67.7 kg     BMI:  Body mass index  is 23.39 kg/m.  Estimated Nutritional Needs:   Kcal:  2000-2200 kcal  Protein:  100-120 grams  Fluid:  >/= 2 L/day  Mariana Single RD, LDN Clinical Nutrition Pager # - [phone]

## 2019-05-13 NOTE — Progress Notes (Signed)
[facility] Progress Note Patient Name: Aaron Avery DOB: 20-May-1966 MRN: [id number]   Date of Service  05/13/2019  HPI/Events of Note  Request for diet - Patient extubated yesterday. Able to tolerate sips of water without incident.   eICU Interventions  Will advance to clear liquid diet.      Intervention Category Major Interventions: Other:  Sommer,Steven Cornelia Copa 05/13/2019, 3:50 AM

## 2019-05-13 NOTE — Progress Notes (Signed)
Pharmacy Antibiotic Note  Aaron Avery is a 53 y.o. male admitted on 05/10/2019 after cardiac arrest.  Pharmacy has been consulted for unasyn dosing for possible aspiration.  Patient underwent hypothermia protocol and is now rewarmed. He is currently afebrile with elevated wbc of 20.   Plan: Unasyn 3g q 6 hours  Height: 5\' 7"  (170.2 cm) Weight: 149 lb 5.1 oz (67.7 kg) IBW/kg (Calculated) : 66.1  Temp (24hrs), Avg:97.9 F (36.6 C), Min:97.6 F (36.4 C), Max:98.3 F (36.8 C)  Recent Labs  Lab 05/10/19 1605 05/10/19 1606  05/10/19 1811 05/11/19 0418  05/12/19 0351  05/12/19 1000 05/12/19 1215 05/12/19 1409 05/12/19 1804 05/13/19 0201  WBC 15.5*  --   --   --  11.9*  --  20.0*  --   --   --   --   --  20.4*  CREATININE 1.49*  --    < > 1.22 0.96   < >  --    < > 0.99 1.01 1.08 1.26* 1.18  LATICACIDVEN  --  3.9*  --  1.9  --   --   --   --   --   --   --   --   --    < > = values in this interval not displayed.    Estimated Creatinine Clearance: 69.2 mL/min (by C-G formula based on SCr of 1.18 mg/dL).    No Known Allergies  Antimicrobials this admission: Unasyn 11/20>>  Microbiology results: 11/17 BCx: ngtd 11/18 TA: mod GPC >reincubate  MRSA PCR: negative  Thank you for allowing pharmacy to be a part of this patient's care.  Erin Hearing PharmD., BCPS Clinical Pharmacist 05/13/2019 11:08 AM

## 2019-05-13 NOTE — Progress Notes (Signed)
Pt to MRI at this time with SWOT RN for cardiac MRI.

## 2019-05-13 NOTE — Progress Notes (Signed)
Called primary MD concerning patient feeling "horrible and restless". MD ordered EKG and medication. RN will continue to monitor.

## 2019-05-13 NOTE — Consult Note (Addendum)
ELECTROPHYSIOLOGY CONSULT NOTE    Patient ID: Aaron Avery MRN: [id number], DOB/AGE: 1966-04-18 53 y.o.  Admit date: 05/10/2019 Date of Consult: 05/13/2019  Primary Physician: Patient, No Pcp Per Primary Cardiologist: New to Dr. Virgina Jock Electrophysiologist: New to Dr. Lovena Le  Referring Provider: Dr. Virgina Jock  Patient Profile: Aaron Avery is a 53 y.o. male with no previously known medical history who is being seen today for the evaluation of cardiac arrest at the request of Dr. Virgina Jock.  HPI:  [name] is a 53 y.o. male with no previously known medical history as above. Pt with "witnessed arrest" 05/10/2019. Pt was pulling out of his parking spot at a store according to bystanders, when he lost consciousness. Bystanders pulled him out of his car and performed CPR. He received AED shock x 1. Unclear from notes if the AED was placed by bystanders or first responders. Pt was unresponsive on arrival to ED and was intubated. EKG showed inferolateral STEMI. Taken emergently to cath lab with no clear culprit lesion.   Echo 05/11/2019 showed LVEF 25-30%, RV low normal function, no significant valvular abnormalities.   Initial labs 11/17 showed K at 2.7. HS-Troponin peaked at 2928. Cr stable.Lactic acid WNL  Pt required intubation and hypothermia protocol.   He is now extubated and normothermic.  On arrival, his sister who works in the hospital, stated he does not take very good care of himself. Eats mostly "red meat" and drinks coca-cola. He'd recently had dental pain but had not been worked up due to finances.  He had complained to her of several months of diaphoresis and dizziness with exertion.   Pt is alert and oriented this am.  He confirms he has had diaphoresis and dizziness with moderate to heavy exertion over the past 2-3 months.   He denies chest pain, tachy palpitations, near syncope, or syncope prior to this admission. He has no previous cardiac history that  he is aware of.   EMS report personally reviewed. Confirms AED was placed and reportedly fired, but prior to their arrival. ECGs PTA show sinus tach with ST abnormalities in inferolateral leads.     LHC 05/10/2019 LM: Normal LAD: Medium sized. Prox LAD/ostial D1 50% disease. TIMI III flow LCx:  Medium sized. LCx with small OM branches.  RCA: Medium sized. Normal  LVEDP 20 mmHg  Past Medical History:  Diagnosis Date  . Toothache      Surgical History:  Past Surgical History:  Procedure Laterality Date  . LEFT HEART CATH AND CORONARY ANGIOGRAPHY N/A 05/10/2019   Procedure: LEFT HEART CATH AND CORONARY ANGIOGRAPHY;  Surgeon: Nigel Mormon, MD;  Location: [facility];  Service: Cardiovascular;  Laterality: N/A;  . None       Medications Prior to Admission  Medication Sig Dispense Refill Last Dose  . ibuprofen (ADVIL) 200 MG tablet Take 800 mg by mouth every 6 (six) hours as needed (dental pain).   Past Week at Unknown time    Inpatient Medications:  . chlorhexidine gluconate (MEDLINE KIT)  15 mL Mouth Rinse BID  . Chlorhexidine Gluconate Cloth  6 each Topical Daily  . heparin injection (subcutaneous)  5,000 Units Subcutaneous Q8H  . [START ON 05/14/2019] influenza vac split quadrivalent PF  0.5 mL Intramuscular Tomorrow-1000  . insulin aspart  0-15 Units Subcutaneous Q4H  . pantoprazole (PROTONIX) IV  40 mg Intravenous Q12H  . [START ON 05/14/2019] pneumococcal 23 valent vaccine  0.5 mL Intramuscular Tomorrow-1000  . sodium  chloride flush  3 mL Intravenous Q12H    Allergies: No Known Allergies  Social History   Socioeconomic History  . Marital status: Divorced    Spouse name: Not on file  . Number of children: Not on file  . Years of education: Not on file  . Highest education level: Not on file  Occupational History  . Not on file  Social Needs  . Financial resource strain: Not on file  . Food insecurity    Worry: Not on file    Inability: Not  on file  . Transportation needs    Medical: Not on file    Non-medical: Not on file  Tobacco Use  . Smoking status: Current Every Day Smoker    Packs/day: 0.50    Types: Cigarettes  . Smokeless tobacco: Never Used  Substance and Sexual Activity  . Alcohol use: No  . Drug use: Yes    Types: Marijuana  . Sexual activity: Not on file  Lifestyle  . Physical activity    Days per week: Not on file    Minutes per session: Not on file  . Stress: Not on file  Relationships  . Social Herbalist on phone: Not on file    Gets together: Not on file    Attends religious service: Not on file    Active member of club or organization: Not on file    Attends meetings of clubs or organizations: Not on file    Relationship status: Not on file  . Intimate partner violence    Fear of current or ex partner: Not on file    Emotionally abused: Not on file    Physically abused: Not on file    Forced sexual activity: Not on file  Other Topics Concern  . Not on file  Social History Narrative  . Not on file     Family History  Adopted: Yes     Review of Systems: All other systems reviewed and are otherwise negative except as noted above.  Physical Exam: Vitals:   05/13/19 0615 05/13/19 0630 05/13/19 0733 05/13/19 0800  BP: 114/73 111/66  119/67  Pulse:      Resp: 19 15  14   Temp:   98.3 F (36.8 C)   TempSrc:   Oral   SpO2: 94% 100%  99%  Weight:      Height:        GEN- The patient is well appearing, alert and oriented x 3 today.   HEENT: normocephalic, atraumatic; sclera clear, conjunctiva pink; hearing intact; oropharynx clear; neck supple Lungs- Clear to ausculation bilaterally, normal work of breathing.  No wheezes, rales, rhonchi Heart- Regular rate and rhythm, no murmurs, rubs or gallops GI- soft, non-tender, non-distended, bowel sounds present Extremities- no clubbing, cyanosis, or edema; DP/PT/radial pulses 2+ bilaterally MS- no significant deformity or atrophy  Skin- warm and dry, no rash or lesion Psych- euthymic mood, full affect Neuro- strength and sensation are intact  Labs:   Lab Results  Component Value Date   WBC 20.4 (H) 05/13/2019   HGB 12.8 (L) 05/13/2019   HCT 39.7 05/13/2019   MCV 91.3 05/13/2019   PLT 216 05/13/2019    Recent Labs  Lab 05/13/19 0201  NA 142  K 3.9  CL 108  CO2 21*  BUN 14  CREATININE 1.18  CALCIUM 8.6*  PROT 5.4*  BILITOT 0.8  ALKPHOS 78  ALT 30  AST 45*  GLUCOSE 67*  Radiology/Studies: Dg Chest 1 View  Result Date: 05/10/2019 CLINICAL DATA:  53 year old male central line placement. EXAM: CHEST  1 VIEW COMPARISON:  1613 hours today. FINDINGS: Portable AP semi upright view at 2112 hours. Right IJ approach central line in place, tip is about 1 vertebral body below the carina. No pneumothorax. Stable cardiac size and mediastinal contours. Stable endotracheal tube tip in good position. Enteric tube side hole is at the level of the distal esophagus. Stable lung volumes and ventilation. Pacer or resuscitation pads project over the chest. Negative visible bowel gas pattern. IMPRESSION: 1. Right IJ central line placed, tip at the lower SVC level. No pneumothorax. 2. Enteric tube side hole at the level of the distal esophagus. Advanced 6 cm to ensure side hole placement within the stomach. 3. Stable ventilation. Electronically Signed   By: Genevie Ann M.D.   On: 05/10/2019 21:28   Ct Head Wo Contrast  Result Date: 05/10/2019 CLINICAL DATA:  53 year old male status post cardiac arrest today. Intubated. EXAM: CT HEAD WITHOUT CONTRAST TECHNIQUE: Contiguous axial images were obtained from the base of the skull through the vertex without intravenous contrast. COMPARISON:  None. FINDINGS: Brain: Normal cisterns and sulci at this time. Gray-white matter differentiation is within normal limits throughout the brain. No midline shift, ventriculomegaly, mass effect, evidence of mass lesion, intracranial hemorrhage or  evidence of cortically based acute infarction. Vascular: Questionable trace intravascular contrast. No suspicious intracranial vascular hyperdensity. Skull: No acute osseous abnormality identified. Sinuses/Orbits: Fluid in the visible pharynx. Visualized paranasal sinuses and mastoids are well pneumatized. Other: Visualized orbits and scalp soft tissues are within normal limits. IMPRESSION: Normal noncontrast CT appearance of the brain at this time. Electronically Signed   By: Genevie Ann M.D.   On: 05/10/2019 19:59   Dg Chest Port 1 View  Result Date: 05/10/2019 CLINICAL DATA:  Intubation EXAM: PORTABLE CHEST 1 VIEW COMPARISON:  None. FINDINGS: Endotracheal tube tip is at or just above the carina. Esophageal tube tip below the diaphragm but incompletely visualized. No focal airspace opacity, pleural effusion or pneumothorax. Normal heart size. IMPRESSION: 1. Endotracheal tube tip at or just above the carina 2. Esophageal tube tip below the diaphragm but incompletely visualized 3. The lungs are grossly clear Electronically Signed   By: Donavan Foil M.D.   On: 05/10/2019 16:29    EKG: on arrival showed sinus tach in the 100 range with difficult to read baseline but concerns for inferolateral ST elevation (personally reviewed) EKG 05/11/2019 shows sinus brady at 49 bpm, QRS 102 ms, PR 144 ms  TELEMETRY:  NSR 60-80s (personally reviewed)  Assessment/Plan: 1.  SCD s/p defib Unwitnessed arrest, reportedly shocked by AED x 1. No arrhythmias noted since admission K 2.7 on admission. Supped. 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 EP asked to see for ICD consideration for secondary prevention.  We discussed risks and benefits of ICD placement including bleeding, infection and damage to heart or lungs at length and pt has agreed to proceed. Will discuss timing with MD, but with plan for cMRI, would plan for next week.   2. 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   For questions or updates, please contact [place] Please consult [email or web address] for contact info under Cardiology/STEMI.  Jacalyn Lefevre, PA-C  05/13/2019 8:56 AM   EP Attending  Patient seen and examined. Agree with the findings as noted above.  The patient was admitted with a VF arrest and ST elevation with no culprit stenosis. The ECG changes are inferior and he was described as having no RCA or LCx disease. He was noted on echo to have severe LV dysfunction. His HS troponin peaked over 2K. He certainly could have had coronary spasm though his symptoms were not suggestive as he was essentially asymptomatc prior to the arrest. 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 Cardiac MRI looking for evidence of infiltrative disease is pending but done. I have reviewed the treatment options with the patient. Unclear what the mechanism of his arrest is though spasm is certainly a possibility. He will undergo ICD insertion. I discussed the indications/risks/benefits/goals/expectations of the procedure with the patient and he is willing to proceed.   Our schedule precludes doing this today. We will plan to place his ICD on Monday, and he could likely go home Monday evening.  Mikle Bosworth.D.

## 2019-05-13 NOTE — Progress Notes (Addendum)
Subjective:  Awake. Knows his name, knows he is in the hospital.  Off pressors. No arrhthymias    Objective:  Vital Signs in the last 24 hours: Temp:  [97.6 F (36.4 C)-98.3 F (36.8 C)] 98.3 F (36.8 C) (11/20 0733) Pulse Rate:  [56-97] 97 (11/20 0400) Resp:  [11-23] 15 (11/20 0630) BP: (79-117)/(43-85) 111/66 (11/20 0630) SpO2:  [86 %-100 %] 100 % (11/20 0630) Arterial Line BP: (73-132)/(17-82) 81/17 (11/19 1800) FiO2 (%):  [36 %-50 %] 36 % (11/19 1717)  Intake/Output from previous day: 11/19 0701 - 11/20 0700 In: 2904.5 [P.O.:120; I.V.:1002.6; NG/GT:483.2; IV Piggyback:1298.7] Out: 1161 [Urine:1161]  Physical Exam  Constitutional: He appears well-developed and well-nourished. No distress.  HENT:  Head: Normocephalic and atraumatic.  Eyes: Pupils are equal, round, and reactive to light. Conjunctivae are normal.  Neck: No JVD present.  Cardiovascular: Normal rate, regular rhythm and intact distal pulses.  No murmur heard. Pulmonary/Chest: Effort normal and breath sounds normal. He has no wheezes. He has no rales.  Abdominal: Soft. Bowel sounds are normal. There is no rebound.  Musculoskeletal:        General: No edema.  Lymphadenopathy:    He has no cervical adenopathy.  Neurological: He is alert. No cranial nerve deficit.  Oriented to self, place  Skin: Skin is warm and dry.  Psychiatric: He has a normal mood and affect.  Nursing note and vitals reviewed.    Lab Results: BMP Recent Labs    05/12/19 1409 05/12/19 1804 05/13/19 0201  NA 139 138 142  K 4.3 4.5 3.9  CL 109 107 108  CO2 21* 22 21*  GLUCOSE 124* 144* 67*  BUN 11 13 14   CREATININE 1.08 1.26* 1.18  CALCIUM 8.4* 8.4* 8.6*  GFRNONAA >60 >60 >60  GFRAA >60 >60 >60    CBC Recent Labs  Lab 05/10/19 1605  05/13/19 0201  WBC 15.5*   < > 20.4*  RBC 5.32   < > 4.35  HGB 15.5   < > 12.8*  HCT 47.6   < > 39.7  PLT 341   < > 216  MCV 89.5   < > 91.3  MCH 29.1   < > 29.4  MCHC 32.6   < >  32.2  RDW 13.8   < > 14.8  LYMPHSABS 2.8  --   --   MONOABS 0.9  --   --   EOSABS 0.1  --   --   BASOSABS 0.1  --   --    < > = values in this interval not displayed.   Results for ANNETTE, LIOTTA (MRN [id number]) as of 05/11/2019 08:08  Ref. Range 05/10/2019 16:05 05/10/2019 18:11  Troponin I (High Sensitivity) Latest Ref Range: <18 ng/L 614 (HH) 2,928 (HH)    HEMOGLOBIN A1C Lab Results  Component Value Date   HGBA1C 6.0 (H) 05/10/2019   MPG 125.5 05/10/2019     Cardiac Studies:  Echocardiogram 05/11/2019: Severe global hypokinesis EF 25-30% No significant valvular abnormality.   Coronary angiography 05/10/2019: LM: Normal LAD: Medium sized. Prox LAD/ostial D1 50% disease. TIMI III flow LCx: Medium sized. LCx with small OM branches.  RCA: Medium sized. Normal  LVEDP 20 mmHg LVEF 35-40% with global hypokinesis  EKG11/17/2020 1607: Sinus tachycardia 101 bpm. Inferolateral STEMI  EKG11/18/2020: Sinus bradycardia 49 bpm. No ischemic changes.  EEG 05/11/2019:  This study is suggestive of profound diffuse encephalopathy, nonspecific to etiology but could be related to sedation, anoxic brain injury. No  seizures or epileptiform discharges were seen throughout the recording.  Assessment & Recommendations:  53 year oldCaucasian male,smoker, no other known medical comorbidities s/p cardiac arrest, and inferolateral STEMI.  Cardiac arrest: Of hospital cardiac arrest,reportedly shockable rhythm,defibrillated x1. No acute coronary etiology identified. Nonobstructive CAD. EF 25-30%, global hypokinesis. Uncertain about chronicity. Euvolumic. Will repeat limited echo before discharge. K 2.7on admission, without any hypokalemia changes. Npo arrhythmia during hospitalization.  Recommend DAPT for ACS, will start after ICD placement. Awake, alert, oriented X 2 after awakening from hypothermia protocol, extubated.  Consulted EP for ICD placement.  Will get cardiac  MRI.   Move to stepdown unit today.     Elder Negus, M.D. 05/13/2019, 8:23 AM [facility] Pager: [phone] Office: [phone] If no answer: [phone]

## 2019-05-14 ENCOUNTER — Inpatient Hospital Stay (HOSPITAL_COMMUNITY): Payer: Self-pay

## 2019-05-14 DIAGNOSIS — F17200 Nicotine dependence, unspecified, uncomplicated: Secondary | ICD-10-CM

## 2019-05-14 DIAGNOSIS — I21A1 Myocardial infarction type 2: Secondary | ICD-10-CM

## 2019-05-14 LAB — BASIC METABOLIC PANEL
Anion gap: 10 (ref 5–15)
BUN: 11 mg/dL (ref 6–20)
CO2: 22 mmol/L (ref 22–32)
Calcium: 8.7 mg/dL — ABNORMAL LOW (ref 8.9–10.3)
Chloride: 109 mmol/L (ref 98–111)
Creatinine, Ser: 1.2 mg/dL (ref 0.61–1.24)
GFR calc Af Amer: >60 mL/min (ref >60)
GFR calc non Af Amer: >60 mL/min (ref >60)
Glucose, Bld: 92 mg/dL (ref 70–99)
Potassium: 3.7 mmol/L (ref 3.5–5.1)
Sodium: 141 mmol/L (ref 135–145)

## 2019-05-14 LAB — CBC
HCT: 35.4 % — ABNORMAL LOW (ref 39.0–52.0)
Hemoglobin: 11.6 g/dL — ABNORMAL LOW (ref 13.0–17.0)
MCH: 28.8 pg (ref 26.0–34.0)
MCHC: 32.8 g/dL (ref 30.0–36.0)
MCV: 87.8 fL (ref 80.0–100.0)
Platelets: 229 10*3/uL (ref 150–400)
RBC: 4.03 MIL/uL — ABNORMAL LOW (ref 4.22–5.81)
RDW: 14.6 % (ref 11.5–15.5)
WBC: 12.8 10*3/uL — ABNORMAL HIGH (ref 4.0–10.5)
nRBC: 0 % (ref 0.0–0.2)

## 2019-05-14 LAB — MAGNESIUM: Magnesium: 1.9 mg/dL (ref 1.7–2.4)

## 2019-05-14 LAB — PHOSPHORUS: Phosphorus: 1.6 mg/dL — ABNORMAL LOW (ref 2.5–4.6)

## 2019-05-14 LAB — GLUCOSE, CAPILLARY: Glucose-Capillary: 97 mg/dL (ref 70–99)

## 2019-05-14 MED ORDER — ASPIRIN EC 81 MG PO TBEC: 81.0000 mg | DELAYED_RELEASE_TABLET | Freq: Every day | ORAL | 3 refills | Status: DC

## 2019-05-14 MED ORDER — AMLODIPINE BESYLATE 2.5 MG PO TABS: 2.5000 mg | ORAL_TABLET | Freq: Every day | ORAL | 2 refills | Status: DC

## 2019-05-14 MED ORDER — ASPIRIN EC 81 MG PO TBEC: 81.0000 mg | DELAYED_RELEASE_TABLET | Freq: Every day | ORAL | 3 refills | Status: AC

## 2019-05-14 MED ORDER — LOSARTAN POTASSIUM 25 MG PO TABS: 25.0000 mg | ORAL_TABLET | Freq: Every day | ORAL | 11 refills | Status: DC

## 2019-05-14 MED ORDER — CARVEDILOL 6.25 MG PO TABS: 6.2500 mg | ORAL_TABLET | Freq: Two times a day (BID) | ORAL | 11 refills | Status: DC

## 2019-05-14 NOTE — Progress Notes (Signed)
Patient does not want to stay for ICD and wants to leave. Says " I would rather die than staying here". Sister is coming to explain her but wants to go home. Discussed risk of sudden cardiac death, MI or stroke. He understands the risk but still wants to leave.   Discussed outpatient ICD and says "I will call if interested". Outpatient information provided.   Per Nurse, Dr. Virgina Jock will discharge patient on medication management.

## 2019-05-14 NOTE — Discharge Instructions (Signed)
DO NOT DRIVE UNTIL CLEARED BY YOUR DOCTORS.  TAKE THE MEDICATIONS PRESCRIBED AND KEEP YOUR DOCTORS APPOINTMENTS< CALL IF PROBLEMS WITH MEDICATIONS  STOP SMOKING
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
## 2019-05-14 NOTE — Progress Notes (Signed)
Patient refused 0400 CBG and 0600 heparin.  Stated "they hurt and I don't need them". Patient educated.   Kathleene Hazel RN

## 2019-05-14 NOTE — Plan of Care (Signed)
Pt has verbalized the need to leave the hospital stating that "I'm done, I do not want the procedure, I would like to go home this morning." The Charge RN was notified of the pt's wishes. The pt's sister Aaron Avery was contacted and notified of the pt's current position on leaving the hospital. The writer of this note spoke with the pt's sister and let the pt's speak with his sister himself. Pt stated to his sister that he is leaving and needs clothing. The pt's sister stated that she is on the way and that she is taking her time because she does not want him to leave the hospital. She is concerned for the pt's safety and would like for him to stay. The Probation officer of this note notified Branch, MD covering for cardiology. Branch, MD states he will send his PA to come and talk with the pt in attempt to get him to stay. Pt's sister Aaron Avery called back and was updated about the current situation. Will continue to monitor and assess. Will continue to update the care team.

## 2019-05-15 LAB — URINE DRUGS OF ABUSE SCREEN W ALC, ROUTINE (REF LAB)
Amphetamines, Urine: NEGATIVE ng/mL
Barbiturate, Ur: NEGATIVE ng/mL
Cocaine (Metab.): NEGATIVE ng/mL
Ethanol U, Quan: NEGATIVE %
Methadone Screen, Urine: NEGATIVE ng/mL
Opiate Quant, Ur: NEGATIVE ng/mL
Phencyclidine, Ur: NEGATIVE ng/mL
Propoxyphene, Urine: NEGATIVE ng/mL

## 2019-05-15 LAB — PANEL 799049
CARBOXY THC GC/MS CONF: >300 ng/mL
Cannabinoid GC/MS, Ur: POSITIVE — AB

## 2019-05-15 LAB — CULTURE, BLOOD (ROUTINE X 2)
Culture: NO GROWTH
Culture: NO GROWTH
Special Requests: ADEQUATE

## 2019-05-15 LAB — DRUG PROFILE 799031: BENZODIAZEPINES: NEGATIVE

## 2019-05-16 ENCOUNTER — Telehealth: Payer: Self-pay

## 2019-05-16 ENCOUNTER — Telehealth: Payer: Self-pay | Admitting: Cardiology

## 2019-05-16 SURGERY — ICD IMPLANT

## 2019-05-16 NOTE — Telephone Encounter (Signed)
Location of hospitalization: [facility] Reason for hospitalization: Cardiac arrest Date of discharge: 05/14/2019 Date of first communication with patient: today Person contacting patient: Cheri Kearns RMA Current symptoms: Unable to sleep, not able to eat; Pt daughter concerned  Do you understand why you were in the Hospital: Yes Questions regarding discharge instructions: None Where were you discharged to: Home Medications reviewed: Yes Allergies reviewed: Yes Dietary changes reviewed: Yes. Discussed low fat and low salt diet.  Referals reviewed: NA Activities of Daily Living: Able to with mild limitations Any transportation issues/concerns: None Any patient concerns: None Confirmed importance & date/time of Follow up appt: Yes Confirmed with patient if condition begins to worsen call. Pt was given the office number and encouraged to call back with questions or concerns: Yes

## 2019-05-16 NOTE — Telephone Encounter (Signed)
She can try tylenol PM or benadryl and also melatonin

## 2019-05-16 NOTE — Telephone Encounter (Signed)
Pt daughter Levada Dy called wanting to let you know that pt has not slept at all hes afraid to he thinks that he will no wake up and she wants to know if there is anything that he can take otc to help him sleep?  Pt also has not been eating and he is defecating on himself and she has been trying to get him to eat but he wont;  Please advise  [phone]

## 2019-05-17 NOTE — Telephone Encounter (Signed)
Daughter is aware 

## 2019-05-30 ENCOUNTER — Ambulatory Visit (INDEPENDENT_AMBULATORY_CARE_PROVIDER_SITE_OTHER): Payer: Self-pay | Admitting: Cardiology

## 2019-05-30 ENCOUNTER — Other Ambulatory Visit: Payer: Self-pay

## 2019-05-30 ENCOUNTER — Encounter: Payer: Self-pay | Admitting: Cardiology

## 2019-05-30 VITALS — BP 117/80 | HR 73 | Temp 97.3°F | Resp 16 | Ht 68.0 in | Wt 147.6 lb

## 2019-05-30 DIAGNOSIS — F17210 Nicotine dependence, cigarettes, uncomplicated: Secondary | ICD-10-CM

## 2019-05-30 DIAGNOSIS — I5181 Takotsubo syndrome: Secondary | ICD-10-CM

## 2019-05-30 DIAGNOSIS — I469 Cardiac arrest, cause unspecified: Secondary | ICD-10-CM

## 2019-05-30 DIAGNOSIS — R739 Hyperglycemia, unspecified: Secondary | ICD-10-CM

## 2019-05-30 DIAGNOSIS — F17200 Nicotine dependence, unspecified, uncomplicated: Secondary | ICD-10-CM

## 2019-05-30 MED ORDER — CARVEDILOL 6.25 MG PO TABS: 6.2500 mg | ORAL_TABLET | Freq: Two times a day (BID) | ORAL | 11 refills | Status: DC
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
## 2019-06-03 MED ORDER — CARVEDILOL 12.5 MG PO TABS: 12.5000 mg | ORAL_TABLET | Freq: Two times a day (BID) | ORAL | 1 refills | Status: DC

## 2019-06-06 NOTE — Addendum Note (Signed)
Addended by: Gwinda Maine on: 06/06/2019 08:42 AM   Modules accepted: Level of Service

## 2019-06-30 ENCOUNTER — Ambulatory Visit (INDEPENDENT_AMBULATORY_CARE_PROVIDER_SITE_OTHER): Payer: Self-pay | Admitting: Cardiology

## 2019-06-30 ENCOUNTER — Other Ambulatory Visit: Payer: Self-pay

## 2019-06-30 ENCOUNTER — Encounter: Payer: Self-pay | Admitting: Cardiology

## 2019-06-30 VITALS — BP 115/75 | HR 65 | Ht 68.0 in | Wt 150.0 lb

## 2019-06-30 DIAGNOSIS — I255 Ischemic cardiomyopathy: Secondary | ICD-10-CM

## 2019-06-30 DIAGNOSIS — Z8674 Personal history of sudden cardiac arrest: Secondary | ICD-10-CM

## 2019-06-30 DIAGNOSIS — I252 Old myocardial infarction: Secondary | ICD-10-CM

## 2019-06-30 DIAGNOSIS — R7989 Other specified abnormal findings of blood chemistry: Secondary | ICD-10-CM

## 2019-06-30 DIAGNOSIS — F17200 Nicotine dependence, unspecified, uncomplicated: Secondary | ICD-10-CM

## 2019-06-30 DIAGNOSIS — F17210 Nicotine dependence, cigarettes, uncomplicated: Secondary | ICD-10-CM

## 2019-06-30 MED ORDER — ATORVASTATIN CALCIUM 10 MG PO TABS: 10.0000 mg | ORAL_TABLET | Freq: Every day | ORAL | 2 refills | Status: DC

## 2019-06-30 MED ORDER — CARVEDILOL 12.5 MG PO TABS: 12.5000 mg | ORAL_TABLET | Freq: Two times a day (BID) | ORAL | 1 refills | Status: DC

## 2019-06-30 NOTE — Progress Notes (Signed)
Primary Physician:  Patient, No Pcp Per   Patient ID: Aaron Avery, male    DOB: 06-Jan-1966, 54 y.o.   MRN: [id number]  Subjective:    Chief Complaint  Patient presents with  . Cardiac Arrest    follow up    HPI: TEAGUE GOYNES  is a 54 y.o. Caucasian male admitted to [facility] on 05/10/2019 with VF arrest needing AED defibrillation, EF 25 to 30% by echocardiogram and normal coronary arteries.  No prior medical history, he was found to have mild hyperglycemia and has history of tobacco use disorder.  No family history of sudden cardiac death or premature coronary artery disease.  He had transient ST elevation in the inferior leads. Patient was recommended ICD but patient wanted to walk AMA but was discharged.  He was started on carvedilol and losartan for cardiomyopathy and low-dose amlodipine for possible coronary spasm  He is a smoker.  He was also found to be hyperglycemic.  Patient states that he is doing well.  He has been compliant with his medications.  He has not had any recurrence of near syncope or syncope.  No chest pain, shortness of breath, or palpitations.  He has been walking 10 to 15 minutes at a time with his sister each day and tolerates this well. He still continues to smoke.   Past Medical History:  Diagnosis Date  . Toothache     Past Surgical History:  Procedure Laterality Date  . LEFT HEART CATH AND CORONARY ANGIOGRAPHY N/A 05/10/2019   Procedure: LEFT HEART CATH AND CORONARY ANGIOGRAPHY;  Surgeon: Nigel Mormon, MD;  Location: [facility];  Service: Cardiovascular;  Laterality: N/A;  . None      Social History   Socioeconomic History  . Marital status: Divorced    Spouse name: Not on file  . Number of children: 2  . Years of education: Not on file  . Highest education level: Not on file  Occupational History  . Not on file  Tobacco Use  . Smoking status: Current Every Day Smoker    Packs/day: 0.50    Types: Cigarettes   . Smokeless tobacco: Never Used  Substance and Sexual Activity  . Alcohol use: No  . Drug use: Yes    Types: Marijuana  . Sexual activity: Not on file  Other Topics Concern  . Not on file  Social History Narrative  . Not on file   Social Determinants of Health   Financial Resource Strain:   . Difficulty of Paying Living Expenses: Not on file  Food Insecurity:   . Worried About Charity fundraiser in the Last Year: Not on file  . Ran Out of Food in the Last Year: Not on file  Transportation Needs:   . Lack of Transportation (Medical): Not on file  . Lack of Transportation (Non-Medical): Not on file  Physical Activity:   . Days of Exercise per Week: Not on file  . Minutes of Exercise per Session: Not on file  Stress:   . Feeling of Stress : Not on file  Social Connections:   . Frequency of Communication with Friends and Family: Not on file  . Frequency of Social Gatherings with Friends and Family: Not on file  . Attends Religious Services: Not on file  . Active Member of Clubs or Organizations: Not on file  . Attends Archivist Meetings: Not on file  . Marital Status: Not on file  Intimate  Partner Violence:   . Fear of Current or Ex-Partner: Not on file  . Emotionally Abused: Not on file  . Physically Abused: Not on file  . Sexually Abused: Not on file   Review of Systems  Constitution: Negative for weight gain.  Cardiovascular: Negative for claudication, dyspnea on exertion, leg swelling and syncope.  Respiratory: Negative for hemoptysis.   Endocrine: Negative for cold intolerance.  Hematologic/Lymphatic: Does not bruise/bleed easily.  Gastrointestinal: Negative for hematochezia and melena.  Neurological: Negative for headaches and light-headedness.   Objective:  Blood pressure 115/75, pulse 65, height _0  (1.727 m), weight 150 lb (68 kg), SpO2 100 %. Body mass index is 22.81 kg/m.    Physical Exam  Constitutional:  He is moderately built and  well-nourished in no acute distress, appears older than stated age.  HENT:  Head: Atraumatic.  Cardiovascular: Normal rate, regular rhythm, intact distal pulses and normal pulses. Exam reveals distant heart sounds. Exam reveals no gallop.  No murmur heard. No leg edema, no JVD. Normal vascular exam  Pulmonary/Chest: Effort normal and breath sounds normal.  Barrel-shaped chest, decreased breath sounds.  Abdominal: Soft. Bowel sounds are normal.  Neurological: He is alert.  Skin: Skin is warm and dry.  Psychiatric: He has a normal mood and affect.   Radiology: No results found.  Laboratory examination:    CMP Latest Ref Rng & Units 05/14/2019 05/13/2019 05/12/2019  Glucose 70 - 99 mg/dL 92 67(L) 144(H)  BUN 6 - 20 mg/dL _1 Creatinine 0.61 - 1.24 mg/dL 1.20 1.18 1.26(H)  Sodium 135 - 145 mmol/L 141 142 138  Potassium 3.5 - 5.1 mmol/L 3.7 3.9 4.5  Chloride 98 - 111 mmol/L 109 108 107  CO2 22 - 32 mmol/L 22 21(L) 22  Calcium 8.9 - 10.3 mg/dL 8.7(L) 8.6(L) 8.4(L)  Total Protein 6.5 - 8.1 g/dL - 5.4(L) -  Total Bilirubin 0.3 - 1.2 mg/dL - 0.8 -  Alkaline Phos 38 - 126 U/L - 78 -  AST 15 - 41 U/L - 45(H) -  ALT 0 - 44 U/L - 30 -   CBC Latest Ref Rng & Units 05/14/2019 05/13/2019 05/12/2019  WBC 4.0 - 10.5 K/uL 12.8(H) 20.4(H) -  Hemoglobin 13.0 - 17.0 g/dL 11.6(L) 12.8(L) 16.0  Hematocrit 39.0 - 52.0 % 35.4(L) 39.7 47.0  Platelets 150 - 400 K/uL 229 216 -   Lipid Panel     Component Value Date/Time   CHOL 110 05/12/2019 1409   TRIG 89 05/12/2019 1409   HDL 44 05/12/2019 1409   CHOLHDL 2.5 05/12/2019 1409   VLDL 18 05/12/2019 1409   LDLCALC 48 05/12/2019 1409   HEMOGLOBIN A1C Lab Results  Component Value Date   HGBA1C 6.0 (H) 05/10/2019   MPG 125.5 05/10/2019   TSH Recent Labs    05/13/19 0201  TSH 4.777*    PRN Meds:. Medications Discontinued During This Encounter  Medication Reason  . carvedilol (COREG) 12.5 MG tablet Reorder   Current Meds    Medication Sig  . amLODipine (NORVASC) 2.5 MG tablet Take 1 tablet (2.5 mg total) by mouth daily in the afternoon.  Marland Kitchen aspirin EC 81 MG tablet Take 1 tablet (81 mg total) by mouth daily.  . carvedilol (COREG) 12.5 MG tablet Take 1 tablet (12.5 mg total) by mouth 2 (two) times daily.  Marland Kitchen losartan (COZAAR) 25 MG tablet Take 1 tablet (25 mg total) by mouth daily.  . [DISCONTINUED] carvedilol (COREG) 12.5 MG tablet Take 1  tablet (12.5 mg total) by mouth 2 (two) times daily.    Cardiac Studies:    Cardiac MRI 05/13/2019:  1. Subendocardial late gadolinium enhancement in the basal to mid anterolateral and mid to apical inferolateral walls. This is consistent with a myocardial infarction, likely in an OM territory 2. Normal LV size with normal global systolic function (EF 40%) with regional wall motion abnormalities. Hypokinesis of basal anterolateral, mid anterolateral/inferolateral and apical lateral walls 3.  Normal RV size and systolic function (EF 08%).  Echocardiogram 05/11/2019: 1. Left ventricular ejection fraction, by visual estimation, is 25 to 30%. The left ventricle has severely decreased function. There is no left ventricular hypertrophy. 2. The left ventricle demonstrates global hypokinesis. 3. Global right ventricle has low normal systolic function.The right ventricular size is normal. No increase in right ventricular wall thickness. 4. Mild mitral leaflet thickening. No signifiant valvular regurgitation/stenosis.  Left heart catheterization 05/11/2019: Normal coronary arteries. EF 35 to 40% with global hypokinesis. Mild elevation in LVEDP at 20 mmHg.  Assessment:     ICD-10-CM   1. Ischemic cardiomyopathy  I25.5 PCV ECHOCARDIOGRAM COMPLETE    Lipid Panel With LDL/HDL Ratio    LDL cholesterol, direct    CMP14+EGFR    TSH  2. History of cardiac arrest  Z86.74 EKG 12-Lead  3. Tobacco use disorder  F17.200   4. History of myocardial infarction  I25.2 carvedilol (COREG)  12.5 MG tablet    atorvastatin (LIPITOR) 10 MG tablet  5. Abnormal TSH  R79.89 TSH    EKG 06/29/2018: Sinus bradycardia at rate of 59 bpm, normal axis, no evidence of ischemia, normal EKG.  EKG 05/11/2019: Marked sinus bradycardia at rate of 49 bpm, prolonged QT interval. Early repolarization. Compared to 05/10/2019, sinus tachycardia at rate of 104 bpm with ST elevation in the inferior and lateral leads not present. ST depression in anterior leads suggestive of posterior STEMI not present.  Recommendations:   Aaron Avery  is a 54 y.o. Caucasian male admitted to [facility] on 05/10/2019 with VF arrest needing AED defibrillation, EF 25 to 30% by echocardiogram and normal coronary arteries.  No prior medical history, he was found to have mild hyperglycemia and has history of tobacco use disorder.  No family history of sudden cardiac death or premature coronary artery disease.  Presently on Coreg and losartan for cardiomyopathy and amlodipine was added for possible coronary vasospasm as on presentation he had transient ST elevation in the inferior leads.  Patient's presentation is probably more consistent with ischemic cardiomyopathy and in view of subendocardial scar by MRI of the heart, his cardiac arrest was related to probably coronary spasm versus transient thrombus formation which resolved. Today's EKG shows improvement and essentially normal.  I'll start him on atorvastatin 10 mg daily although his lipids are normal. Continue high dose Coreg which he is tolerating although his BP is soft.  He is also on amlodipine for presumed coronary spasm, I may consider discontinuing this on his next office visit in 3 months.  He wants to start driving again, I'll repeat echocardiogram in a month and if his LV function is normalized as confirmed by echocardiogram, I'll probably release him back to driving.  Will check lipids, CMP and also TSH as he had abnormal TSH previously in 2 months and  see him back with echo. Smoking cessation discussed for additional 5 minutes and implications on health in general and cardiovascular in specific discussed and offered medications.  I also discussed the findings  with his sister.  Adrian Prows, MD, [facility] 06/30/2019, 4:53 PM [facility]. [place] Office: [phone]

## 2019-07-18 ENCOUNTER — Other Ambulatory Visit: Payer: Self-pay

## 2019-08-15 ENCOUNTER — Ambulatory Visit: Payer: Self-pay

## 2019-08-15 ENCOUNTER — Other Ambulatory Visit (HOSPITAL_COMMUNITY)
Admission: RE | Admit: 2019-08-15 | Discharge: 2019-08-15 | Disposition: A | Discharge status: Home / Self Care | Payer: Self-pay | Source: Ambulatory Visit | Attending: Cardiology | Admitting: Cardiology

## 2019-08-15 ENCOUNTER — Other Ambulatory Visit: Payer: Self-pay

## 2019-08-15 DIAGNOSIS — I255 Ischemic cardiomyopathy: Secondary | ICD-10-CM | POA: Insufficient documentation

## 2019-08-15 LAB — LIPID PANEL
Cholesterol: 186 mg/dL (ref 0–200)
HDL: 46 mg/dL (ref >40)
LDL Cholesterol: 119 mg/dL — ABNORMAL HIGH (ref 0–99)
Total CHOL/HDL Ratio: 4 RATIO
Triglycerides: 106 mg/dL (ref <150)
VLDL: 21 mg/dL (ref 0–40)

## 2019-08-15 LAB — COMPREHENSIVE METABOLIC PANEL
ALT: 15 U/L (ref 0–44)
AST: 14 U/L — ABNORMAL LOW (ref 15–41)
Albumin: 4 g/dL (ref 3.5–5.0)
Alkaline Phosphatase: 64 U/L (ref 38–126)
Anion gap: 10 (ref 5–15)
BUN: 6 mg/dL (ref 6–20)
CO2: 28 mmol/L (ref 22–32)
Calcium: 9.6 mg/dL (ref 8.9–10.3)
Chloride: 102 mmol/L (ref 98–111)
Creatinine, Ser: 1.19 mg/dL (ref 0.61–1.24)
GFR calc Af Amer: >60 mL/min (ref >60)
GFR calc non Af Amer: >60 mL/min (ref >60)
Glucose, Bld: 108 mg/dL — ABNORMAL HIGH (ref 70–99)
Potassium: 4 mmol/L (ref 3.5–5.1)
Sodium: 140 mmol/L (ref 135–145)
Total Bilirubin: 0.7 mg/dL (ref 0.3–1.2)
Total Protein: 6.7 g/dL (ref 6.5–8.1)

## 2019-08-15 LAB — TSH: TSH: 3.314 u[IU]/mL (ref 0.350–4.500)

## 2019-08-15 LAB — LDL CHOLESTEROL, DIRECT: Direct LDL: 121.8 mg/dL — ABNORMAL HIGH (ref 0–99)

## 2019-08-15 NOTE — Progress Notes (Signed)
TSH normal, LDL has increased, previously well controlled.  Will discuss at office visit.

## 2019-08-22 ENCOUNTER — Telehealth: Payer: Self-pay

## 2019-08-22 NOTE — Telephone Encounter (Signed)
Do not understand the question

## 2019-08-22 NOTE — Telephone Encounter (Signed)
Pt called to ask if he is able to car again. Please advise thank you

## 2019-08-23 NOTE — Telephone Encounter (Signed)
Sorry I might to say drive again.

## 2019-09-22 ENCOUNTER — Ambulatory Visit: Payer: Self-pay | Admitting: Cardiology

## 2019-09-28 NOTE — Progress Notes (Signed)
Primary Physician:  Patient, No Pcp Per   Patient ID: Aaron Avery, male    DOB: 07-Jun-1966, 54 y.o.   MRN: [id number]  Subjective:    Chief Complaint  Patient presents with  . Cardiomyopathy  . Follow-up    HPI: Aaron Avery  is a 54 y.o. Caucasian male admitted to [facility] on 05/10/2019 with VF arrest needing AED defibrillation, EF 25 to 30% by echocardiogram and normal coronary arteries, mild hyperglycemia and has history of tobacco use disorder.  Due to presence of scar tissue on cardiac MRI, felt that his symptoms and presentation more consistent with ischemic cardiomyopathy, coronary spasm versus ACS with resolution of thrombus right to the etiology.  No family history of sudden cardiac death or premature coronary artery disease. He was started on carvedilol and losartan for cardiomyopathy and low-dose amlodipine for possible coronary spasm  He is a smoker.  He was also found to be hyperglycemic.  Patient states that he is doing well.  He has been compliant with his medications.  He has not had any recurrence of near syncope or syncope.  No chest pain, shortness of breath, or palpitations.  He has been walking 10 to 15 minutes at a time with his sister each day and tolerates this well. He still continues to smoke.   Past Medical History:  Diagnosis Date  . Cardiomyopathy ([place])   . Heart attack ([place]) 05/10/2019  . Toothache     Past Surgical History:  Procedure Laterality Date  . LEFT HEART CATH AND CORONARY ANGIOGRAPHY N/A 05/10/2019   Procedure: LEFT HEART CATH AND CORONARY ANGIOGRAPHY;  Surgeon: Nigel Mormon, MD;  Location: [facility];  Service: Cardiovascular;  Laterality: N/A;  . None      Social History   Socioeconomic History  . Marital status: Divorced    Spouse name: Not on file  . Number of children: 2  . Years of education: Not on file  . Highest education level: Not on file  Occupational History  . Not on file  Tobacco  Use  . Smoking status: Current Every Day Smoker    Packs/day: 0.50    Types: Cigarettes  . Smokeless tobacco: Never Used  Substance and Sexual Activity  . Alcohol use: No  . Drug use: Yes    Types: Marijuana  . Sexual activity: Not Currently  Other Topics Concern  . Not on file  Social History Narrative  . Not on file   Social Determinants of Health   Financial Resource Strain:   . Difficulty of Paying Living Expenses:   Food Insecurity:   . Worried About Charity fundraiser in the Last Year:   . Arboriculturist in the Last Year:   Transportation Needs:   . Film/video editor (Medical):   Marland Kitchen Lack of Transportation (Non-Medical):   Physical Activity:   . Days of Exercise per Week:   . Minutes of Exercise per Session:   Stress:   . Feeling of Stress :   Social Connections:   . Frequency of Communication with Friends and Family:   . Frequency of Social Gatherings with Friends and Family:   . Attends Religious Services:   . Active Member of Clubs or Organizations:   . Attends Archivist Meetings:   Marland Kitchen Marital Status:   Intimate Partner Violence:   . Fear of Current or Ex-Partner:   . Emotionally Abused:   Marland Kitchen Physically Abused:   .  Sexually Abused:    Review of Systems  Cardiovascular: Negative for chest pain, dyspnea on exertion and leg swelling.  Gastrointestinal: Negative for melena.   Objective:  Blood pressure 122/80, pulse 72, height 5\' 8"  (1.727 m), weight 151 lb (68.5 kg), SpO2 98 %. Body mass index is 22.96 kg/m.    Vitals with BMI 10/25/2019 09/29/2019 06/30/2019  Height 5\' 8"  5\' 8"  5\' 8"   Weight 154 lbs 151 lbs 150 lbs  BMI 23.42 22.96 22.81  Systolic 127 122 08/28/2019  Diastolic 75 80 75  Pulse 56 72 65    Physical Exam  Constitutional:  He is moderately built and well-nourished in no acute distress, appears older than stated age.  Cardiovascular: Normal rate, regular rhythm, intact distal pulses and normal pulses. Exam reveals distant heart sounds.  Exam reveals no gallop.  No murmur heard. No leg edema, no JVD. Normal vascular exam  Pulmonary/Chest: Effort normal and breath sounds normal.  Barrel-shaped chest, decreased breath sounds.  Abdominal: Soft. Bowel sounds are normal.   Radiology: No results found.  Laboratory examination:    CMP Latest Ref Rng & Units 08/15/2019 05/14/2019 05/13/2019  Glucose 70 - 99 mg/dL 268) 92 08/17/2019)  BUN 6 - 20 mg/dL 6 11 14   Creatinine 0.61 - 1.24 mg/dL 05/16/2019 05/15/2019 341(D  Sodium 135 - 145 mmol/L 140 141 142  Potassium 3.5 - 5.1 mmol/L 4.0 3.7 3.9  Chloride 98 - 111 mmol/L 102 109 108  CO2 22 - 32 mmol/L 28 22 21(L)  Calcium 8.9 - 10.3 mg/dL 9.6 62(I) )  Total Protein 6.5 - 8.1 g/dL 6.7 - 5.4(L)  Total Bilirubin 0.3 - 1.2 mg/dL 0.7 - 0.8  Alkaline Phos 38 - 126 U/L 64 - 78  AST 15 - 41 U/L 14(L) - 45(H)  ALT 0 - 44 U/L 15 - 30   CBC Latest Ref Rng & Units 05/14/2019 05/13/2019 05/12/2019  WBC 4.0 - 10.5 K/uL 12.8(H) 20.4(H) -  Hemoglobin 13.0 - 17.0 g/dL 11.6(L) 12.8(L) 16.0  Hematocrit 39.0 - 52.0 % 35.4(L) 39.7 47.0  Platelets 150 - 400 K/uL 229 216 -   Lipid Panel     Component Value Date/Time   CHOL 186 08/15/2019 0950   TRIG 106 08/15/2019 0950   HDL 46 08/15/2019 0950   CHOLHDL 4.0 08/15/2019 0950   VLDL 21 08/15/2019 0950   LDLCALC 119 (H) 08/15/2019 0950   LDLDIRECT 121.8 (H) 08/15/2019 0950   HEMOGLOBIN A1C Lab Results  Component Value Date   HGBA1C 6.0 (H) 05/10/2019   MPG 125.5 05/10/2019   TSH Recent Labs    05/13/19 0201 08/15/19 0950  TSH 4.777* 3.314   PRN Meds:. There are no discontinued medications. Current Meds  Medication Sig  . aspirin EC 81 MG tablet Take 1 tablet (81 mg total) by mouth daily.  . [DISCONTINUED] amLODipine (NORVASC) 2.5 MG tablet Take 1 tablet (2.5 mg total) by mouth daily in the afternoon.  . [DISCONTINUED] carvedilol (COREG) 12.5 MG tablet Take 1 tablet (12.5 mg total) by mouth 2 (two) times daily.  . [DISCONTINUED]  losartan (COZAAR) 25 MG tablet Take 1 tablet (25 mg total) by mouth daily.    Cardiac Studies:    Cardiac MRI 05/13/2019:  1. Subendocardial late gadolinium enhancement in the basal to mid anterolateral and mid to apical inferolateral walls. This is consistent with a myocardial infarction, likely in an OM territory 2. Normal LV size with normal global systolic function (EF 56%) with regional wall motion  abnormalities. Hypokinesis of basal anterolateral, mid anterolateral/inferolateral and apical lateral walls 3.  Normal RV size and systolic function (EF 55%).  Echocardiogram 05/11/2019: 1. Left ventricular ejection fraction, by visual estimation, is 25 to 30%. The left ventricle has severely decreased function. There is no left ventricular hypertrophy. 2. The left ventricle demonstrates global hypokinesis. 3. Global right ventricle has low normal systolic function.The right ventricular size is normal. No increase in right ventricular wall thickness. 4. Mild mitral leaflet thickening. No signifiant valvular regurgitation/stenosis.  Left heart catheterization 05/11/2019: Normal coronary arteries. EF 35 to 40% with global hypokinesis. Mild elevation in LVEDP at 20 mmHg.  Echocardiogram 08/15/2019:  Mildly depressed LV systolic function with visual EF 45-50%. Left  ventricle cavity is normal in size. Normal global wall motion. Doppler  evidence of grade I (impaired) diastolic dysfunction, normal LAP. No  concentric left ventricular hypertrophy. No obvious regional wall motion  abnormalities.  Mild pulmonic regurgitation. No other significant valvular abnormalities.  IVC is dilated with a respiratory response of >50%.  Compared to 05/11/2019, EF improved from 25-30%  Assessment:     ICD-10-CM   1. Ischemic cardiomyopathy  I25.5   2. History of cardiac arrest  Z86.74 EKG 12-Lead  3. Tobacco use disorder  F17.200   4. Hypercholesteremia  E78.00     EKG 06/29/2018: Sinus bradycardia  at rate of 59 bpm, normal axis, no evidence of ischemia, normal EKG.  EKG 05/11/2019: Marked sinus bradycardia at rate of 49 bpm, prolonged QT interval. Early repolarization. Compared to 05/10/2019, sinus tachycardia at rate of 104 bpm with ST elevation in the inferior and lateral leads not present. ST depression in anterior leads suggestive of posterior STEMI not present.  Recommendations:   Aaron Avery  is a 54 y.o. Caucasian male admitted to [facility] on 05/10/2019 with VF arrest needing AED defibrillation, EF 25 to 30% by echocardiogram and normal coronary arteries, mild hyperglycemia and has history of tobacco use disorder.  Due to presence of scar tissue on cardiac MRI, felt that his symptoms and presentation more consistent with ischemic cardiomyopathy, coronary spasm versus ACS with resolution of thrombus right to the etiology.  No family history of sudden cardiac death or premature coronary artery disease.    Presently on Coreg and losartan for cardiomyopathy and amlodipine was added for possible coronary vasospasm as on presentation he had transient ST elevation in the inferior leads.  I'll start him on atorvastatin 10 mg daily although his lipids are normal. Continue high dose Coreg which he is tolerating although his BP is soft.  He is also on amlodipine for presumed coronary spasm, I may consider discontinuing this on his next office visit in 3 months.  He wants to start driving again, I'll repeat echocardiogram in a month and if his LV function is normalized as confirmed by echocardiogram, I'll probably release him back to driving.  Will check lipids, CMP and also TSH as he had abnormal TSH previously in 2 months and see him back with echo. Smoking cessation discussed for additional 5 minutes and implications on health in general and cardiovascular in specific discussed and offered medications.  I also discussed the findings with his sister.  Yates Decamp, MD,  [facility] 11/09/2019, 11:10 PM [facility] Office: [phone]

## 2019-09-29 ENCOUNTER — Other Ambulatory Visit: Payer: Self-pay | Admitting: Cardiology

## 2019-09-29 ENCOUNTER — Telehealth: Payer: Self-pay

## 2019-09-29 ENCOUNTER — Encounter: Payer: Self-pay | Admitting: Cardiology

## 2019-09-29 ENCOUNTER — Other Ambulatory Visit: Payer: Self-pay

## 2019-09-29 ENCOUNTER — Ambulatory Visit: Payer: Self-pay | Admitting: Cardiology

## 2019-09-29 VITALS — BP 122/80 | HR 72 | Ht 68.0 in | Wt 151.0 lb

## 2019-09-29 DIAGNOSIS — I252 Old myocardial infarction: Secondary | ICD-10-CM

## 2019-09-29 MED ORDER — LOSARTAN POTASSIUM 25 MG PO TABS: 25.0000 mg | ORAL_TABLET | Freq: Every day | ORAL | 3 refills | Status: DC

## 2019-09-29 MED ORDER — CARVEDILOL 12.5 MG PO TABS: 12.5000 mg | ORAL_TABLET | Freq: Two times a day (BID) | ORAL | 3 refills | Status: DC

## 2019-09-29 NOTE — Progress Notes (Signed)
EKG 09/29/2019: Normal sinus rhythm with rate of 62 bpm, normal axis.  No evidence of ischemia, normal EKG.

## 2019-09-29 NOTE — Telephone Encounter (Signed)
Dr. Jacinto Halim has contacted patient.

## 2019-09-29 NOTE — Telephone Encounter (Signed)
Due to patient not being acknowledged, I missed seeing him, patient waited for 1+ hour in the room and find left and made another appointment for revisiting me.  I thanked him for doing this.  We briefly discussed echo report and also we also discussed regarding his blood pressure, states that he has been feeling fatigued and blood pressure has been low.  Advised him to discontinue amlodipine for now.  Prescriptions for 90-day supply for losartan and carvedilol was sent.  His lipids elevated, I did not address this and will discuss with him on his next visit.  I am pleased that his LVEF is improved.Aaron Kotyk Jacinto Halim, MD, [facility] 09/29/2019, 5:29 PM [facility] Office: [phone]

## 2019-09-29 NOTE — Telephone Encounter (Signed)
Pt had a appt on 09/29/2019 and left w/out being seen due to wait. His sister said he needs refill on all meds, especially his Carvedilol.  Send meds to the [facility]

## 2019-10-05 MED ORDER — ATORVASTATIN CALCIUM 10 MG PO TABS: 10.0000 mg | ORAL_TABLET | Freq: Every day | ORAL | 2 refills | Status: DC

## 2019-10-24 NOTE — Progress Notes (Signed)
Primary Physician:  Patient, No Pcp Per   Patient ID: Aaron Avery, male    DOB: [birth year], 54 y.o.   MRN: [id number]   Chief Complaint  Patient presents with  . Cardiomyopathy  . Follow-up    3 month    HPI:   Aaron Avery  is a 54 y.o. Caucasian male admitted to [facility] on 05/10/2019 with VF arrest needing AED defibrillation, EF 25 to 30% by echocardiogram and normal coronary arteries, mild hyperglycemia and has history of tobacco use disorder.  Due to presence of scar tissue on cardiac MRI, felt that his symptoms and presentation more consistent with ischemic cardiomyopathy, coronary spasm versus ACS with resolution of thrombus felt to be the etiology.  No family history of sudden cardiac death or premature coronary artery disease. He was started on carvedilol and losartan for cardiomyopathy and low-dose amlodipine for possible coronary spasm, did not tolerate amlodipine.  Past medical history significant for heavy tobacco use disorder hyperglycemia and hyperlipidemia.  States that he is feeling the best he has in quite a while, he has not had any dizziness or syncope and is presently not driving.    Past Medical History:  Diagnosis Date  . Cardiomyopathy ([facility])   . Heart attack ([facility]) 05/10/2019  . Toothache     Past Surgical History:  Procedure Laterality Date  . LEFT HEART CATH AND CORONARY ANGIOGRAPHY N/A 05/10/2019   Procedure: LEFT HEART CATH AND CORONARY ANGIOGRAPHY;  Surgeon: Elder Negus, MD;  Location: [facility];  Service: Cardiovascular;  Laterality: N/A;  . None     Family History  Adopted: Yes   Social History   Tobacco Use  . Smoking status: Current Every Day Smoker    Packs/day: 0.50    Types: Cigarettes  . Smokeless tobacco: Never Used  Substance Use Topics  . Alcohol use: No   Marital Status: Divorced ROS:   Review of Systems  Cardiovascular: Negative for chest pain, dyspnea on exertion and leg swelling.    Gastrointestinal: Negative for melena.   Objective:  Blood pressure 127/75, pulse (!) 56, temperature (!) 97.4 F (36.3 C), temperature source Temporal, resp. rate 15, height 5\' 8"  (1.727 m), weight 154 lb (69.9 kg), SpO2 98 %. Body mass index is 23.42 kg/m.    Physical Exam  Constitutional:  He is moderately built and well-nourished in no acute distress, appears older than stated age.  Cardiovascular: Normal rate, regular rhythm, intact distal pulses and normal pulses. Exam reveals distant heart sounds. Exam reveals no gallop.  No murmur heard. No leg edema, no JVD. Normal vascular exam  Pulmonary/Chest: Effort normal and breath sounds normal.  Barrel-shaped chest, decreased breath sounds.  Abdominal: Soft. Bowel sounds are normal.   Laboratory examination:   CMP Latest Ref Rng & Units 08/15/2019 05/14/2019 05/13/2019  Glucose 70 - 99 mg/dL 05/15/2019) 92 532(D)  BUN 6 - 20 mg/dL 6 11 14   Creatinine 0.61 - 1.24 mg/dL 92(E 2.68  Sodium 135 - 145 mmol/L 140 141 142  Potassium 3.5 - 5.1 mmol/L 4.0 3.7 3.9  Chloride 98 - 111 mmol/L 102 109 108  CO2 22 - 32 mmol/L 28 22 21(L)  Calcium 8.9 - 10.3 mg/dL 9.6 3.41) 9.62)  Total Protein 6.5 - 8.1 g/dL 6.7 - 5.4(L)  Total Bilirubin 0.3 - 1.2 mg/dL 0.7 - 0.8  Alkaline Phos 38 - 126 U/L 64 - 78  AST 15 - 41 U/L 14(L) - 45(H)  ALT 0 - 44 U/L 15 - 30   CBC Latest Ref Rng & Units 05/14/2019 05/13/2019 05/12/2019  WBC 4.0 - 10.5 K/uL 12.8(H) 20.4(H) -  Hemoglobin 13.0 - 17.0 g/dL 11.6(L) 12.8(L) 16.0  Hematocrit 39.0 - 52.0 % 35.4(L) 39.7 47.0  Platelets 150 - 400 K/uL 229 216 -   Lipid Panel     Component Value Date/Time   CHOL 186 08/15/2019 0950   TRIG 106 08/15/2019 0950   HDL 46 08/15/2019 0950   CHOLHDL 4.0 08/15/2019 0950   VLDL 21 08/15/2019 0950   LDLCALC 119 (H) 08/15/2019 0950   LDLDIRECT 121.8 (H) 08/15/2019 0950   HEMOGLOBIN A1C Lab Results  Component Value Date   HGBA1C 6.0 (H) 05/10/2019   MPG 125.5 05/10/2019    TSH Recent Labs    05/13/19 0201 08/15/19 0950  TSH 4.777* 3.314   PRN Meds:. Medications Discontinued During This Encounter  Medication Reason  . carvedilol (COREG) 12.5 MG tablet Reorder  . losartan (COZAAR) 25 MG tablet Reorder   Current Meds  Medication Sig  . aspirin EC 81 MG tablet Take 1 tablet (81 mg total) by mouth daily.  Marland Kitchen atorvastatin (LIPITOR) 10 MG tablet Take 1 tablet (10 mg total) by mouth daily.  . carvedilol (COREG) 12.5 MG tablet Take 1 tablet (12.5 mg total) by mouth 2 (two) times daily.  Marland Kitchen losartan (COZAAR) 25 MG tablet Take 1 tablet (25 mg total) by mouth daily.  . [DISCONTINUED] carvedilol (COREG) 12.5 MG tablet Take 1 tablet (12.5 mg total) by mouth 2 (two) times daily.  . [DISCONTINUED] losartan (COZAAR) 25 MG tablet Take 1 tablet (25 mg total) by mouth daily.   Medication and allergies:   No Known Allergies   Current Outpatient Medications  Medication Instructions  . aspirin EC 81 mg, Oral, Daily  . atorvastatin (LIPITOR) 10 mg, Oral, Daily  . carvedilol (COREG) 12.5 mg, Oral, 2 times daily  . losartan (COZAAR) 25 mg, Oral, Daily   Radiology:   No results found.  Cardiac Studies:    Cardiac MRI 05/13/2019:  1. Subendocardial late gadolinium enhancement in the basal to mid anterolateral and mid to apical inferolateral walls. This is consistent with a myocardial infarction, likely in an OM territory 2. Normal LV size with normal global systolic function (EF 47%) with regional wall motion abnormalities. Hypokinesis of basal anterolateral, mid anterolateral/inferolateral and apical lateral walls 3.  Normal RV size and systolic function (EF 09%).  Echocardiogram 05/11/2019: 1. Left ventricular ejection fraction, by visual estimation, is 25 to 30%. The left ventricle has severely decreased function. There is no left ventricular hypertrophy. 2. The left ventricle demonstrates global hypokinesis. 3. Global right ventricle has low normal systolic  function.The right ventricular size is normal. No increase in right ventricular wall thickness. 4. Mild mitral leaflet thickening. No signifiant valvular regurgitation/stenosis.  Left heart catheterization 05/11/2019: Normal coronary arteries. EF 35 to 40% with global hypokinesis. Mild elevation in LVEDP at 20 mmHg.  Echocardiogram 08/15/2019:  Mildly depressed LV systolic function with visual EF 45-50%. Left  ventricle cavity is normal in size. Normal global wall motion. Doppler  evidence of grade I (impaired) diastolic dysfunction, normal LAP. No concentric left ventricular hypertrophy. No obvious regional wall motion  abnormalities.  Mild pulmonic regurgitation. No other significant valvular abnormalities.  IVC is dilated with a respiratory response of >50%.  Compared to 05/11/2019, EF improved from 25-30%   EKG  09/29/2019: Normal sinus rhythm with rate of 62 bpm, normal axis.  No evidence of ischemia, normal EKG.    05/11/2019: Marked sinus bradycardia at rate of 49 bpm, prolonged QT interval. Early repolarization. Compared to 05/10/2019, sinus tachycardia at rate of 104 bpm with ST elevation in the inferior and lateral leads not present. ST depression in anterior leads suggestive of posterior STEMI not present.  06/29/2018: Sinus bradycardia at rate of 59 bpm, normal axis, no evidence of ischemia, normal EKG.   Assessment:     ICD-10-CM   1. Ischemic cardiomyopathy  I25.5 CBC    carvedilol (COREG) 12.5 MG tablet    losartan (COZAAR) 25 MG tablet  2. History of cardiac arrest  Z86.74   3. Tobacco use disorder  F17.200   4. Hypercholesteremia  E78.00 Lipid Panel With LDL/HDL Ratio    Recommendations:   Aaron Avery  is a 54 y.o. Caucasian male admitted to [facility] on 05/10/2019 with VF arrest needing AED defibrillation, EF 25 to 30% by echocardiogram and normal coronary arteries, mild hyperglycemia and has history of tobacco use disorder.  Due to presence of  scar tissue on cardiac MRI, felt that his symptoms and presentation more consistent with ischemic cardiomyopathy, coronary spasm versus ACS with resolution of thrombus felt to be the etiology.  No family history of sudden cardiac death or premature coronary artery disease. Past medical history significant for heavy tobacco use disorder hyperglycemia and hyperlipidemia.    I reviewed the results of the echocardiogram with the patient and his sister, his LVEF has improved.  Continue present medications, normal blood pressure without any dizziness, he can resume driving in 2 weeks as he has not had any syncope in the past 6 months.  No need for ICD implantation.  I will repeat labs including CMP and lipid profile testing.  He is tolerating atorvastatin without any side effects.  I would like to see him back in 6 months for follow-up.  I have refilled his prescriptions.  He has reduced his smoking, he is attempting to completely quit smoking but states that he will not be able to quit smoking "pot".  At least this is a headway, I encouraged him to completely be abstinent from cigarette smoking.  Yates Decamp, MD, [facility] 10/25/2019, 6:40 PM [facility] Pager: [phone] Office: [phone]

## 2019-10-25 ENCOUNTER — Other Ambulatory Visit: Payer: Self-pay

## 2019-10-25 ENCOUNTER — Encounter: Payer: Self-pay | Admitting: Cardiology

## 2019-10-25 ENCOUNTER — Ambulatory Visit: Payer: Self-pay | Admitting: Cardiology

## 2019-10-25 VITALS — BP 127/75 | HR 56 | Temp 97.4°F | Resp 15 | Ht 68.0 in | Wt 154.0 lb

## 2019-10-25 DIAGNOSIS — E7800 Pure hypercholesterolemia, unspecified: Secondary | ICD-10-CM

## 2019-10-25 DIAGNOSIS — Z8674 Personal history of sudden cardiac arrest: Secondary | ICD-10-CM

## 2019-10-25 DIAGNOSIS — F17200 Nicotine dependence, unspecified, uncomplicated: Secondary | ICD-10-CM

## 2019-10-25 DIAGNOSIS — I255 Ischemic cardiomyopathy: Secondary | ICD-10-CM

## 2019-10-25 MED ORDER — LOSARTAN POTASSIUM 25 MG PO TABS: 25.0000 mg | ORAL_TABLET | Freq: Every day | ORAL | 3 refills | Status: DC

## 2019-10-25 MED ORDER — CARVEDILOL 12.5 MG PO TABS: 12.5000 mg | ORAL_TABLET | Freq: Two times a day (BID) | ORAL | 3 refills | Status: DC

## 2020-01-10 ENCOUNTER — Other Ambulatory Visit: Payer: Self-pay

## 2020-01-10 ENCOUNTER — Other Ambulatory Visit: Payer: Self-pay | Admitting: Cardiology

## 2020-01-10 DIAGNOSIS — I252 Old myocardial infarction: Secondary | ICD-10-CM

## 2020-01-10 MED ORDER — ATORVASTATIN CALCIUM 10 MG PO TABS: 10.0000 mg | ORAL_TABLET | Freq: Every day | ORAL | 1 refills | Status: DC

## 2020-02-07 ENCOUNTER — Ambulatory Visit: Payer: Self-pay | Attending: Internal Medicine

## 2020-02-07 DIAGNOSIS — Z23 Encounter for immunization: Secondary | ICD-10-CM

## 2020-02-07 NOTE — Progress Notes (Signed)
   Covid-19 Vaccination Clinic  Name:  Aaron Avery    MRN: [id number] DOB: 07/28/1965  02/07/2020  [name] was observed post Covid-19 immunization for 15 minutes without incident. He was provided with Vaccine Information Sheet and instruction to access the V-Safe system.   [name] was instructed to call 911 with any severe reactions post vaccine: Marland Kitchen Difficulty breathing  . Swelling of face and throat  . A fast heartbeat  . A bad rash all over body  . Dizziness and weakness   Immunizations Administered    Name Date Dose VIS Date Route   JANSSEN COVID-19 VACCINE 02/07/2020  3:21 PM 0.5 mL 08/20/2019 Intramuscular   Manufacturer: [employer]   Lot: [id number]   NDC: 76720-947-09

## 2020-04-23 ENCOUNTER — Other Ambulatory Visit: Payer: Self-pay

## 2020-04-23 DIAGNOSIS — I255 Ischemic cardiomyopathy: Secondary | ICD-10-CM

## 2020-04-23 DIAGNOSIS — E7800 Pure hypercholesterolemia, unspecified: Secondary | ICD-10-CM

## 2020-04-24 ENCOUNTER — Other Ambulatory Visit (HOSPITAL_COMMUNITY)
Admission: RE | Admit: 2020-04-24 | Discharge: 2020-04-24 | Disposition: A | Discharge status: Home / Self Care | Payer: Self-pay | Source: Ambulatory Visit | Attending: Cardiology | Admitting: Cardiology

## 2020-04-24 DIAGNOSIS — E7800 Pure hypercholesterolemia, unspecified: Secondary | ICD-10-CM | POA: Insufficient documentation

## 2020-04-24 DIAGNOSIS — I255 Ischemic cardiomyopathy: Secondary | ICD-10-CM | POA: Insufficient documentation

## 2020-04-24 LAB — CBC WITH DIFFERENTIAL/PLATELET
Abs Immature Granulocytes: 0.01 10*3/uL (ref 0.00–0.07)
Basophils Absolute: 0.1 10*3/uL (ref 0.0–0.1)
Basophils Relative: 1 %
Eosinophils Absolute: 0.2 10*3/uL (ref 0.0–0.5)
Eosinophils Relative: 2 %
HCT: 49.3 % (ref 39.0–52.0)
Hemoglobin: 16.2 g/dL (ref 13.0–17.0)
Immature Granulocytes: 0 %
Lymphocytes Relative: 33 %
Lymphs Abs: 2.2 10*3/uL (ref 0.7–4.0)
MCH: 29.4 pg (ref 26.0–34.0)
MCHC: 32.9 g/dL (ref 30.0–36.0)
MCV: 89.5 fL (ref 80.0–100.0)
Monocytes Absolute: 0.4 10*3/uL (ref 0.1–1.0)
Monocytes Relative: 5 %
Neutro Abs: 3.8 10*3/uL (ref 1.7–7.7)
Neutrophils Relative %: 59 %
Platelets: 299 10*3/uL (ref 150–400)
RBC: 5.51 MIL/uL (ref 4.22–5.81)
RDW: 13.6 % (ref 11.5–15.5)
WBC: 6.5 10*3/uL (ref 4.0–10.5)
nRBC: 0 % (ref 0.0–0.2)

## 2020-04-24 LAB — LIPID PANEL
Cholesterol: 193 mg/dL (ref 0–200)
HDL: 39 mg/dL — ABNORMAL LOW (ref >40)
LDL Cholesterol: 122 mg/dL — ABNORMAL HIGH (ref 0–99)
Total CHOL/HDL Ratio: 4.9 RATIO
Triglycerides: 158 mg/dL — ABNORMAL HIGH (ref <150)
VLDL: 32 mg/dL (ref 0–40)

## 2020-04-26 ENCOUNTER — Encounter: Payer: Self-pay | Admitting: Cardiology

## 2020-04-26 ENCOUNTER — Ambulatory Visit: Payer: Self-pay | Admitting: Cardiology

## 2020-04-26 ENCOUNTER — Other Ambulatory Visit: Payer: Self-pay

## 2020-04-26 VITALS — BP 135/87 | HR 60 | Resp 16 | Ht 68.0 in | Wt 153.0 lb

## 2020-04-26 DIAGNOSIS — Z8674 Personal history of sudden cardiac arrest: Secondary | ICD-10-CM

## 2020-04-26 DIAGNOSIS — K047 Periapical abscess without sinus: Secondary | ICD-10-CM

## 2020-04-26 DIAGNOSIS — I10 Essential (primary) hypertension: Secondary | ICD-10-CM

## 2020-04-26 DIAGNOSIS — I252 Old myocardial infarction: Secondary | ICD-10-CM

## 2020-04-26 DIAGNOSIS — I255 Ischemic cardiomyopathy: Secondary | ICD-10-CM

## 2020-04-26 DIAGNOSIS — E7800 Pure hypercholesterolemia, unspecified: Secondary | ICD-10-CM

## 2020-04-26 DIAGNOSIS — I428 Other cardiomyopathies: Secondary | ICD-10-CM

## 2020-04-26 MED ORDER — LOSARTAN POTASSIUM 50 MG PO TABS: 50.0000 mg | ORAL_TABLET | Freq: Every day | ORAL | 3 refills | Status: DC

## 2020-04-26 MED ORDER — AMOXICILLIN-POT CLAVULANATE 500-125 MG PO TABS: 1.0000 | ORAL_TABLET | Freq: Three times a day (TID) | ORAL | 0 refills | Status: DC

## 2020-04-26 MED ORDER — CARVEDILOL 12.5 MG PO TABS: 12.5000 mg | ORAL_TABLET | Freq: Two times a day (BID) | ORAL | 3 refills | Status: DC

## 2020-04-26 MED ORDER — ROSUVASTATIN CALCIUM 10 MG PO TABS: 10.0000 mg | ORAL_TABLET | Freq: Every day | ORAL | 1 refills | Status: DC

## 2020-04-26 NOTE — Progress Notes (Signed)
Primary Physician:  Patient, No Pcp Per   Patient ID: Aaron Avery, male    DOB: 1965/11/18, 54 y.o.   MRN: [id number]   Chief Complaint  Patient presents with  . Cardiomyopathy  . Follow-up    6 month    HPI:   JACORI MULROONEY  is a 54 y.o. Caucasian male admitted to [facility] on 05/10/2019 with VF arrest in the field, needing AED defibrillation, EF 25 to 30% by echocardiogram and normal coronary arteries, MRI revealing a focal scar in the obtuse marginal territory, mild hyperglycemia and has history of tobacco use disorder.  Due to presence of scar tissue on cardiac MRI, felt that his symptoms and presentation more consistent with ischemic cardiomyopathy, coronary spasm versus ACS with resolution of thrombus felt to be the etiology.  No family history of sudden cardiac death or premature coronary artery disease.  He has not had any further chest pain, dizziness, syncope or near syncope. He complains of poor dental hygiene and has developed pain in his left upper molar tooth. He is trying to get a dental appointment and request antibiotics.  Past Medical History:  Diagnosis Date  . Cardiomyopathy (HCC)   . Heart attack (HCC) 05/10/2019  . Toothache     Past Surgical History:  Procedure Laterality Date  . LEFT HEART CATH AND CORONARY ANGIOGRAPHY N/A 05/10/2019   Procedure: LEFT HEART CATH AND CORONARY ANGIOGRAPHY;  Surgeon: Elder Negus, MD;  Location: [facility] INVASIVE CV LAB;  Service: Cardiovascular;  Laterality: N/A;  . None     Family History  Adopted: Yes   Social History   Tobacco Use  . Smoking status: Current Every Day Smoker    Packs/day: 0.50    Types: Cigarettes  . Smokeless tobacco: Never Used  Substance Use Topics  . Alcohol use: No   Marital Status: Divorced ROS:   Review of Systems  Cardiovascular: Negative for chest pain, dyspnea on exertion and leg swelling.  Gastrointestinal: Negative for melena.   Objective:  Blood pressure  135/87, pulse 60, resp. rate 16, height 5\' 8"  (1.727 m), weight 153 lb (69.4 kg), SpO2 98 %. Body mass index is 23.26 kg/m.  Vitals with BMI 04/26/2020 04/26/2020 10/25/2019  Height - 5\' 8"  5\' 8"   Weight - 153 lbs 154 lbs  BMI - 23.27 23.42  Systolic 135 144 12/25/2019  Diastolic 87 100 75  Pulse 60 61 56    Physical Exam Constitutional:      Comments: He is moderately built and well-nourished in no acute distress, appears older than stated age.  Cardiovascular:     Rate and Rhythm: Normal rate and regular rhythm.     Pulses: Normal pulses and intact distal pulses.     Heart sounds: Heart sounds are distant. No murmur heard.  No gallop.      Comments: No leg edema, no JVD. Normal vascular exam Pulmonary:     Effort: Pulmonary effort is normal.     Breath sounds: Normal breath sounds.  Abdominal:     General: Bowel sounds are normal.     Palpations: Abdomen is soft.    Laboratory examination:   CMP Latest Ref Rng & Units 08/15/2019 05/14/2019 05/13/2019  Glucose 70 - 99 mg/dL 08/17/2019) 92 05/16/2019)  BUN 6 - 20 mg/dL 6 11 14   Creatinine 0.61 - 1.24 mg/dL 05/15/2019 505(L 97(Q  Sodium 135 - 145 mmol/L 140 141 142  Potassium 3.5 - 5.1 mmol/L 4.0 3.7 3.9  Chloride 98 - 111 mmol/L 102 109 108  CO2 22 - 32 mmol/L 28 22 21(L)  Calcium 8.9 - 10.3 mg/dL 9.6 1.6(X) 0.9(U)  Total Protein 6.5 - 8.1 g/dL 6.7 - 5.4(L)  Total Bilirubin 0.3 - 1.2 mg/dL 0.7 - 0.8  Alkaline Phos 38 - 126 U/L 64 - 78  AST 15 - 41 U/L 14(L) - 45(H)  ALT 0 - 44 U/L 15 - 30   CBC Latest Ref Rng & Units 04/24/2020 05/14/2019 05/13/2019  WBC 4.0 - 10.5 K/uL 6.5 12.8(H) 20.4(H)  Hemoglobin 13.0 - 17.0 g/dL 04.5 11.6(L) 12.8(L)  Hematocrit 39 - 52 % 49.3 35.4(L) 39.7  Platelets 150 - 400 K/uL 299 229 216   Lipid Panel Recent Labs    05/12/19 1409 08/15/19 0950 04/24/20 0825  CHOL 110 186 193  TRIG 89 106 158*  LDLCALC 48 119* 122*  VLDL 18 21 32  HDL 44 46 39*  CHOLHDL 2.5 4.0 4.9  LDLDIRECT  --  121.8*  --     HEMOGLOBIN  A1C Lab Results  Component Value Date   HGBA1C 6.0 (H) 05/10/2019   MPG 125.5 05/10/2019   TSH Recent Labs    05/13/19 0201 08/15/19 0950  TSH 4.777* 3.314   PRN Meds:. Medications Discontinued During This Encounter  Medication Reason  . atorvastatin (LIPITOR) 10 MG tablet Side effect (s)  . carvedilol (COREG) 12.5 MG tablet Reorder  . losartan (COZAAR) 25 MG tablet    Current Meds  Medication Sig  . aspirin EC 81 MG tablet Take 1 tablet (81 mg total) by mouth daily.  . carvedilol (COREG) 12.5 MG tablet Take 1 tablet (12.5 mg total) by mouth 2 (two) times daily.  Marland Kitchen losartan (COZAAR) 50 MG tablet Take 1 tablet (50 mg total) by mouth daily.  . [DISCONTINUED] carvedilol (COREG) 12.5 MG tablet Take 1 tablet (12.5 mg total) by mouth 2 (two) times daily.  . [DISCONTINUED] losartan (COZAAR) 25 MG tablet Take 1 tablet (25 mg total) by mouth daily.   Medication and allergies:   No Known Allergies   Current Outpatient Medications on File Prior to Visit  Medication Sig Dispense Refill  . aspirin EC 81 MG tablet Take 1 tablet (81 mg total) by mouth daily. 180 tablet 3   No current facility-administered medications on file prior to visit.    Radiology:   No results found.  Cardiac Studies:    Cardiac MRI 05/13/2019:  1. Subendocardial late gadolinium enhancement in the basal to mid anterolateral and mid to apical inferolateral walls. This is consistent with a myocardial infarction, likely in an OM territory. 2. Normal LV size with normal global systolic function (EF 56%) with regional wall motion abnormalities. Hypokinesis of basal anterolateral, mid anterolateral/inferolateral and apical lateral walls 3.  Normal RV size and systolic function (EF 55%).  Left heart catheterization 05/11/2019: Normal coronary arteries. EF 35 to 40% with global hypokinesis. Mild elevation in LVEDP at 20 mmHg.  Echocardiogram 08/15/2019:  Mildly depressed LV systolic function with visual EF  45-50%. Left  ventricle cavity is normal in size. Normal global wall motion. Doppler  evidence of grade I (impaired) diastolic dysfunction, normal LAP. No concentric left ventricular hypertrophy. No obvious regional wall motion  abnormalities.  Mild pulmonic regurgitation. No other significant valvular abnormalities.  IVC is dilated with a respiratory response of >50%.  Compared to 05/11/2019, EF improved from 25-30%   EKG  EKG 04/26/2020: Sinus bradycardia at rate of 58 bpm, normal axis.  Incomplete right bundle branch block.  Otherwise normal EKG.  No significant change from prior EKG 09/29/2019.  05/11/2019: Marked sinus bradycardia at rate of 49 bpm, prolonged QT interval. Early repolarization. Compared to 05/10/2019, sinus tachycardia at rate of 104 bpm with ST elevation in the inferior and lateral leads not present. ST depression in anterior leads suggestive of posterior STEMI not present.  Assessment:     ICD-10-CM   1. Non-ischemic cardiomyopathy (HCC)  I42.8 EKG 12-Lead  2. Hypercholesteremia  E78.00 rosuvastatin (CRESTOR) 10 MG tablet    LDL cholesterol, direct  3. History of myocardial infarction  I25.2   4. History of cardiac arrest  Z86.74   5. Dental abscess  K04.7 amoxicillin-clavulanate (AUGMENTIN) 500-125 MG tablet  6. Ischemic cardiomyopathy  I25.5 losartan (COZAAR) 50 MG tablet    carvedilol (COREG) 12.5 MG tablet  7. Primary hypertension  I10 losartan (COZAAR) 50 MG tablet    Recommendations:   KENSLEY LARES  is a 54 y.o. Caucasian male admitted [facility] on 05/10/2019 with VF arrest in the field, needing AED defibrillation, EF 25 to 30% by echocardiogram and normal coronary arteries, MRI revealing a focal scar in the obtuse marginal territory, mild hyperglycemia and has history of tobacco use disorder.  Due to presence of scar tissue on cardiac MRI, felt that his symptoms and presentation more consistent with ischemic cardiomyopathy, coronary spasm  versus ACS with resolution of thrombus felt to be the etiology.  No family history of sudden cardiac death or premature coronary artery disease.   He presents here for 63-month office visit and has discontinued taking Lipitor due to not feeling well and fatigue and myalgias. I reviewed his lipids, obviously his LDL is risen. We will try Crestor 10 mg daily.  With regard to cardiomyopathy, fortunately the ejection fraction has improved suspect nonischemic cardiomyopathy and still scar tissue noted by MRI suggest either coronary spasm as an etiology as well. He does have a hyperglycemia and hypertension, advised him to increase losartan to 50 mg daily.  He complains of poor dental hygiene and has developed pain in his left upper molar tooth. He is trying to get a dental appointment and request antibiotics. As he does not have a PCP and has no dentist, I have given him prescription for Augmentin but advised him strongly that he needs to see a dentist within a week to 2 weeks to exclude any deep-rooted abscess that could potentially lead to meningitis. His Sister Marylene Land is present and all questions answered.  We will obtain direct LDL in 2 to 3 months. Otherwise no changes in the medications were done today. Smoking cessation was discussed at length, he appears to be motivated and has set Christmas 2021 as his goal quit date. I will see him back in 6 months.   Yates Decamp, MD, [facility] 04/26/2020, 6:30 PM Office: [phone] Pager: [phone]

## 2020-10-25 ENCOUNTER — Ambulatory Visit: Payer: Self-pay | Admitting: Cardiology

## 2020-10-31 ENCOUNTER — Other Ambulatory Visit (HOSPITAL_COMMUNITY)
Admission: RE | Admit: 2020-10-31 | Discharge: 2020-10-31 | Disposition: A | Discharge status: Home / Self Care | Payer: Self-pay | Source: Other Acute Inpatient Hospital | Attending: Cardiology | Admitting: Cardiology

## 2020-10-31 DIAGNOSIS — E7800 Pure hypercholesterolemia, unspecified: Secondary | ICD-10-CM | POA: Insufficient documentation

## 2020-10-31 LAB — LDL CHOLESTEROL, DIRECT: Direct LDL: 129.7 mg/dL — ABNORMAL HIGH (ref 0–99)

## 2020-11-01 ENCOUNTER — Ambulatory Visit: Payer: Self-pay | Admitting: Cardiology

## 2020-11-01 ENCOUNTER — Encounter: Payer: Self-pay | Admitting: Cardiology

## 2020-11-01 ENCOUNTER — Other Ambulatory Visit: Payer: Self-pay

## 2020-11-01 VITALS — BP 105/66 | HR 62 | Temp 98.2°F | Resp 17 | Ht 68.0 in | Wt 150.2 lb

## 2020-11-01 DIAGNOSIS — R739 Hyperglycemia, unspecified: Secondary | ICD-10-CM

## 2020-11-01 DIAGNOSIS — I428 Other cardiomyopathies: Secondary | ICD-10-CM

## 2020-11-01 DIAGNOSIS — I10 Essential (primary) hypertension: Secondary | ICD-10-CM

## 2020-11-01 DIAGNOSIS — I255 Ischemic cardiomyopathy: Secondary | ICD-10-CM

## 2020-11-01 DIAGNOSIS — I5181 Takotsubo syndrome: Secondary | ICD-10-CM

## 2020-11-01 DIAGNOSIS — R7989 Other specified abnormal findings of blood chemistry: Secondary | ICD-10-CM

## 2020-11-01 DIAGNOSIS — E7800 Pure hypercholesterolemia, unspecified: Secondary | ICD-10-CM

## 2020-11-01 MED ORDER — LOSARTAN POTASSIUM 50 MG PO TABS: 50.0000 mg | ORAL_TABLET | Freq: Every day | ORAL | 3 refills | Status: DC

## 2020-11-01 MED ORDER — LOVASTATIN 20 MG PO TABS: 20.0000 mg | ORAL_TABLET | Freq: Every day | ORAL | 1 refills | Status: DC

## 2020-11-01 NOTE — Progress Notes (Signed)
Primary Physician:  Patient, No Pcp Per (Inactive)   Patient ID: Aaron Avery, male    DOB: 11-Jan-1966, 55 y.o.   MRN: [id number]   Chief Complaint  Patient presents with  . Cardiomyopathy  . Hypertension  . Hyperlipidemia    6 MONTHS    HPI:   DESMIN DALEO  is a 55 y.o. Caucasian male admitted to [facility] on 05/10/2019 with VF arrest in the field, needing AED defibrillation, EF 25 to 30% by echocardiogram and normal coronary arteries, MRI revealing a focal scar in the obtuse marginal territory, mild hyperglycemia and has history of tobacco use disorder.  Coronary spasm versus ACS vs Stress cardiomyopathy felt to be the etiology.  No family history of sudden cardiac death or premature coronary artery disease.   He has not had any further chest pain, dizziness, syncope or near syncope.  He complains of increasing fatigue after he takes the medications.  He discontinued Crestor due to myalgias.  Previously did not tolerate Lipitor.  Otherwise no specific complaints.  He quit smoking on 10/21/2020.  Past Medical History:  Diagnosis Date  . Cardiomyopathy ([place])   . Heart attack ([place]) 05/10/2019  . Toothache     Past Surgical History:  Procedure Laterality Date  . LEFT HEART CATH AND CORONARY ANGIOGRAPHY N/A 05/10/2019   Procedure: LEFT HEART CATH AND CORONARY ANGIOGRAPHY;  Surgeon: Nigel Mormon, MD;  Location: [place] CV LAB;  Service: Cardiovascular;  Laterality: N/A;  . None     Family History  Adopted: Yes   Social History   Tobacco Use  . Smoking status: Former Smoker    Packs/day: 0.50    Types: Cigarettes    Quit date: 10/21/2020    Years since quitting: 0.0  . Smokeless tobacco: Never Used  Substance Use Topics  . Alcohol use: No   Marital Status: Divorced ROS:   Review of Systems  Cardiovascular: Negative for chest pain, dyspnea on exertion and leg swelling.  Gastrointestinal: Negative for melena.   Objective:  Blood pressure  105/66, pulse 62, temperature 98.2 F (36.8 C), temperature source Temporal, resp. rate 17, height 5' 8"  (1.727 m), weight 150 lb 3.2 oz (68.1 kg), SpO2 98 %. Body mass index is 22.84 kg/m.  Vitals with BMI 11/01/2020 04/26/2020 04/26/2020  Height 5' 8"  - 5' 8"   Weight 150 lbs 3 oz - 153 lbs  BMI 85.92 - 92.44  Systolic 628 638 177  Diastolic 66 87 116  Pulse 62 60 61    Physical Exam Constitutional:      Comments: He is moderately built and well-nourished in no acute distress, appears older than stated age.  Cardiovascular:     Rate and Rhythm: Normal rate and regular rhythm.     Pulses: Normal pulses and intact distal pulses.     Heart sounds: Heart sounds are distant. No murmur heard. No gallop.      Comments: No leg edema, no JVD. Normal vascular exam Pulmonary:     Effort: Pulmonary effort is normal.     Breath sounds: Normal breath sounds.  Abdominal:     General: Bowel sounds are normal.     Palpations: Abdomen is soft.    Laboratory examination:   CMP Latest Ref Rng & Units 08/15/2019 05/14/2019 05/13/2019  Glucose 70 - 99 mg/dL 108(H) 92 67(L)  BUN 6 - 20 mg/dL 6 11 14   Creatinine 0.61 - 1.24 mg/dL 1.19 1.20 1.18  Sodium 135 - 145  mmol/L 140 141 142  Potassium 3.5 - 5.1 mmol/L 4.0 3.7 3.9  Chloride 98 - 111 mmol/L 102 109 108  CO2 22 - 32 mmol/L 28 22 21(L)  Calcium 8.9 - 10.3 mg/dL 9.6 8.7(L) 8.6(L)  Total Protein 6.5 - 8.1 g/dL 6.7 - 5.4(L)  Total Bilirubin 0.3 - 1.2 mg/dL 0.7 - 0.8  Alkaline Phos 38 - 126 U/L 64 - 78  AST 15 - 41 U/L 14(L) - 45(H)  ALT 0 - 44 U/L 15 - 30   CBC Latest Ref Rng & Units 04/24/2020 05/14/2019 05/13/2019  WBC 4.0 - 10.5 K/uL 6.5 12.8(H) 20.4(H)  Hemoglobin 13.0 - 17.0 g/dL 16.2 11.6(L) 12.8(L)  Hematocrit 39.0 - 52.0 % 49.3 35.4(L) 39.7  Platelets 150 - 400 K/uL 299 229 216   Lipid Panel Recent Labs    04/24/20 0825 10/31/20 0856  CHOL 193  --   TRIG 158*  --   LDLCALC 122*  --   VLDL 32  --   HDL 39*  --   CHOLHDL 4.9   --   LDLDIRECT  --  129.7*    HEMOGLOBIN A1C Lab Results  Component Value Date   HGBA1C 6.0 (H) 05/10/2019   MPG 125.5 05/10/2019   TSH No results for input(s): TSH in the last 8760 hours.       Ref Range & Units 05/12/2020  TSH 0.350 - 4.500 uIU/mL 4.777High       PRN Meds:. Medications Discontinued During This Encounter  Medication Reason  . amoxicillin-clavulanate (AUGMENTIN) 500-125 MG tablet Error  . rosuvastatin (CRESTOR) 10 MG tablet Error  . losartan (COZAAR) 50 MG tablet   . losartan (COZAAR) 50 MG tablet    Current Outpatient Medications on File Prior to Visit  Medication Sig Dispense Refill  . aspirin EC 81 MG tablet Take 1 tablet (81 mg total) by mouth daily. 180 tablet 3  . carvedilol (COREG) 12.5 MG tablet Take 1 tablet (12.5 mg total) by mouth 2 (two) times daily. 180 tablet 3   No current facility-administered medications on file prior to visit.    Medication and allergies:   Allergies  Allergen Reactions  . Atorvastatin Other (See Comments)    Abdominal discomfort, body aches and pain  . Crestor [Rosuvastatin] Other (See Comments)    Myalgia    Outpatient Medications Prior to Visit  Medication Sig Dispense Refill  . aspirin EC 81 MG tablet Take 1 tablet (81 mg total) by mouth daily. 180 tablet 3  . carvedilol (COREG) 12.5 MG tablet Take 1 tablet (12.5 mg total) by mouth 2 (two) times daily. 180 tablet 3  . losartan (COZAAR) 50 MG tablet Take 1 tablet (50 mg total) by mouth daily. 90 tablet 3  . amoxicillin-clavulanate (AUGMENTIN) 500-125 MG tablet Take 1 tablet (500 mg total) by mouth 3 (three) times daily. 21 tablet 0  . rosuvastatin (CRESTOR) 10 MG tablet Take 1 tablet (10 mg total) by mouth daily. 90 tablet 1   No facility-administered medications prior to visit.    Radiology:   No results found.  Cardiac Studies:    Cardiac MRI 05/13/2019:  1. Subendocardial late gadolinium enhancement in the basal to mid anterolateral and mid to  apical inferolateral walls. This is consistent with a myocardial infarction, likely in an OM territory. 2. Normal LV size with normal global systolic function (EF 39%) with regional wall motion abnormalities. Hypokinesis of basal anterolateral, mid anterolateral/inferolateral and apical lateral walls 3.  Normal RV size and  systolic function (EF 84%).  Left heart catheterization 05/11/2019: Normal coronary arteries. EF 35 to 40% with global hypokinesis. Mild elevation in LVEDP at 20 mmHg.  Echocardiogram 08/15/2019:  Mildly depressed LV systolic function with visual EF 45-50%. Left  ventricle cavity is normal in size. Normal global wall motion. Doppler  evidence of grade I (impaired) diastolic dysfunction, normal LAP. No concentric left ventricular hypertrophy. No obvious regional wall motion  abnormalities.  Mild pulmonic regurgitation. No other significant valvular abnormalities.  IVC is dilated with a respiratory response of >50%.  Compared to 05/11/2019, EF improved from 25-30%   EKG  EKG 11/01/2020: Sinus bradycardia at rate of 55 bpm, normal axis.  No evidence of ischemia, normal EKG.  No significant change from EKG 04/26/2020  05/11/2019: Marked sinus bradycardia at rate of 49 bpm, prolonged QT interval. Early repolarization. Compared to 05/10/2019, sinus tachycardia at rate of 104 bpm with ST elevation in the inferior and lateral leads not present. ST depression in anterior leads suggestive of posterior STEMI not present.  Assessment:     ICD-10-CM   1. Primary hypertension  I10 EKG 12-Lead    CMP14+EGFR    CBC    losartan (COZAAR) 50 MG tablet    DISCONTINUED: losartan (COZAAR) 50 MG tablet  2. 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 Non-ischemic cardiomyopathy ([place])  I42.8   4. Hypercholesteremia  E78.00 lovastatin (MEVACOR) 20 MG tablet    Lipid Panel With LDL/HDL Ratio    Lipid Panel With LDL/HDL Ratio    CBC with Differential  5. Ischemic  cardiomyopathy  I25.5 CBC with Differential    losartan (COZAAR) 50 MG tablet    DISCONTINUED: losartan (COZAAR) 50 MG tablet  6. Abnormal TSH  R79.89 TSH  7. Hyperglycemia  R73.9 Hgb A1c w/o eAG   Meds ordered this encounter  Medications  . DISCONTD: losartan (COZAAR) 50 MG tablet    Sig: Take 1 tablet (50 mg total) by mouth daily after supper.    Dispense:  90 tablet    Refill:  3  . lovastatin (MEVACOR) 20 MG tablet    Sig: Take 1 tablet (20 mg total) by mouth at bedtime.    Dispense:  90 tablet    Refill:  1  . losartan (COZAAR) 50 MG tablet    Sig: Take 1 tablet (50 mg total) by mouth daily after supper.    Dispense:  90 tablet    Refill:  3     Recommendations:   JAKAI RISSE  is a 55 y.o. Caucasian male admitted to [facility] on 05/10/2019 with VF arrest in the field, needing AED defibrillation, EF 25 to 30% by echocardiogram and normal coronary arteries, MRI revealing a focal scar in the obtuse marginal territory, mild hyperglycemia and has history of tobacco use disorder.  Coronary spasm versus ACS vs Stress cardiomyopathy felt to be the etiology.  No family history of sudden cardiac death or premature coronary artery disease.   He presents here for 76-monthoffice visit and has discontinued taking Crestor due to not feeling well and fatigue and myalgias.  Previously did not tolerate Lipitor as well.  In view of his cardiac risk factors specifically chronic tobacco use disorder, hyperglycemia, hypertension, I would like to try at least a low-dose statin, he is willing to try lovastatin 20 mg in the evening.  We could certainly add Zetia if lipids are not at goal, will check lipids in 3 months.  He is presently  on carvedilol, tolerating the dose, but due to low blood pressure he feels extremely tired and wants to go for taking the medication.  Advised him to change taking losartan in the morning to taking it in the evening and see how he feels.  Otherwise I may have to  reduce the dose of the losartan as I would like him to continue beta-blocker therapy indefinitely in view of cardiomyopathy.  His EF has improved by echocardiogram done 6 months ago, I will recheck his echo to confirm improvement in LVEF.  He has quit smoking on 10/21/2020 and I congratulated him.  I would like to see him back in 6 months for follow-up.  Will obtain lipids, TSH, CBC and CMP and A1c.  This was a 40-minute office visit encounter in discussions with regard to taking the medications appropriately and importance of medication use and reduction in overall cardiovascular risk.    Adrian Prows, MD, [facility] 11/01/2020, 4:30 PM Office: [phone] Pager: [phone]

## 2020-12-24 IMAGING — DX DG CHEST 1V PORT
1 series · 1 of 1 positions shown · non-contrast
Comparison: None.

CLINICAL DATA: Intubation

EXAM:
PORTABLE CHEST 1 VIEW

[chest]
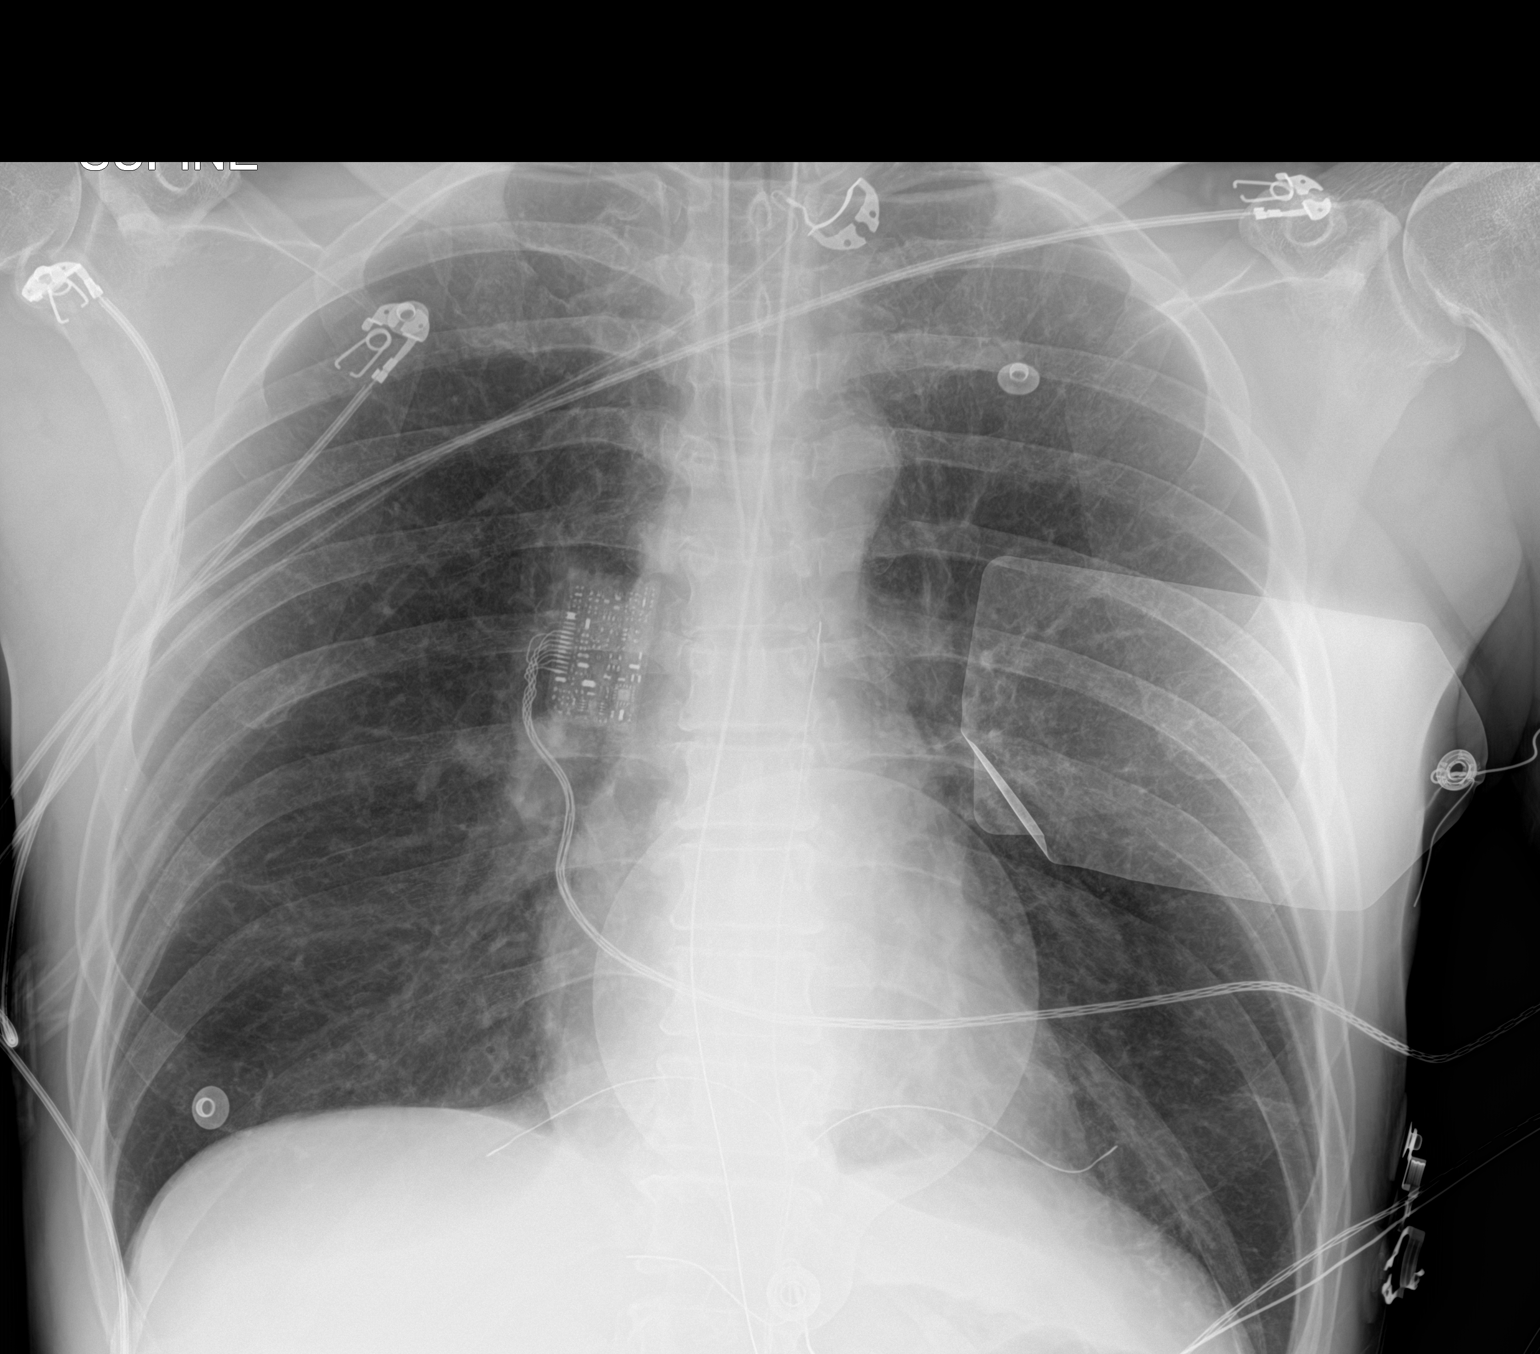

[1 of 1 positions shown; findings below may reference images not displayed]

FINDINGS: Endotracheal tube tip is at or just above the carina. Esophageal
tube tip below the diaphragm but incompletely visualized. No focal
airspace opacity, pleural effusion or pneumothorax. Normal heart
size.
IMPRESSION: 1. Endotracheal tube tip at or just above the carina
2. Esophageal tube tip below the diaphragm but incompletely
visualized
3. The lungs are grossly clear

## 2020-12-24 IMAGING — CT CT HEAD W/O CM
3 series · 15 of 47 positions shown, 18 images · non-contrast
Comparison: None.

CLINICAL DATA: 53-year-old male status post cardiac arrest today.
Intubated.

EXAM:
CT HEAD WITHOUT CONTRAST
TECHNIQUE: Contiguous axial images were obtained from the base of the skull
through the vertex without intravenous contrast.

[Series 4: head 5.0 h30s · axial · 0.41mm/px · z∈[-101,+24]mm · 9 of 31 slices shown, 12 images]
[im 3/31  brain]
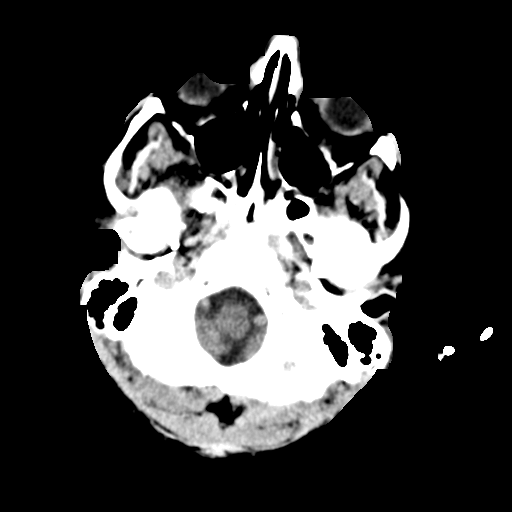
[im 3/31  bone]
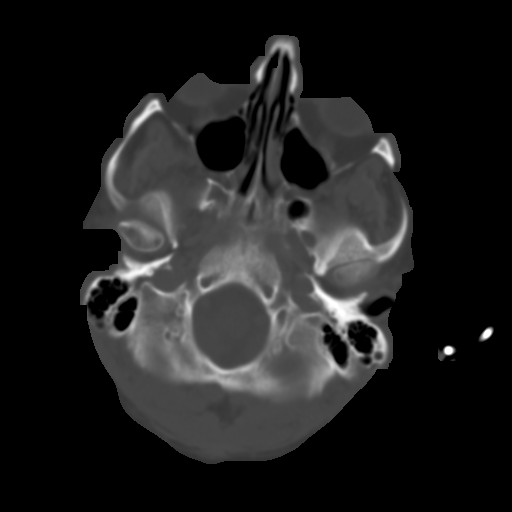
[im 6/31  brain]
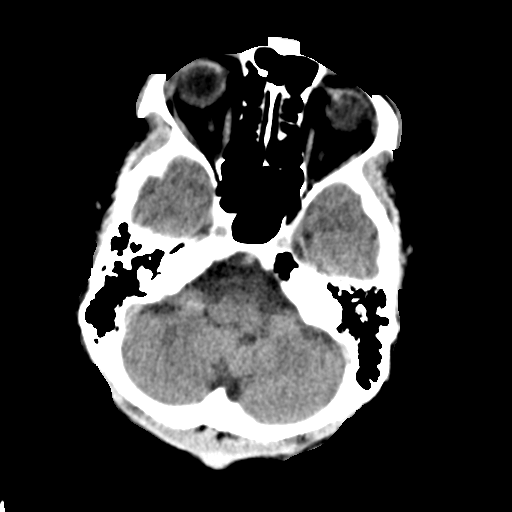
[im 9/31  brain]
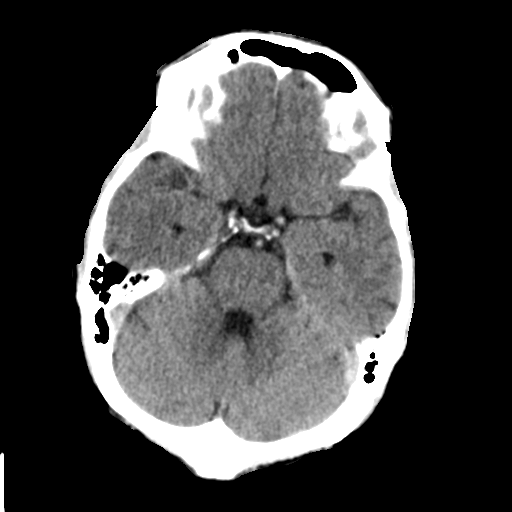
[im 12/31  brain]
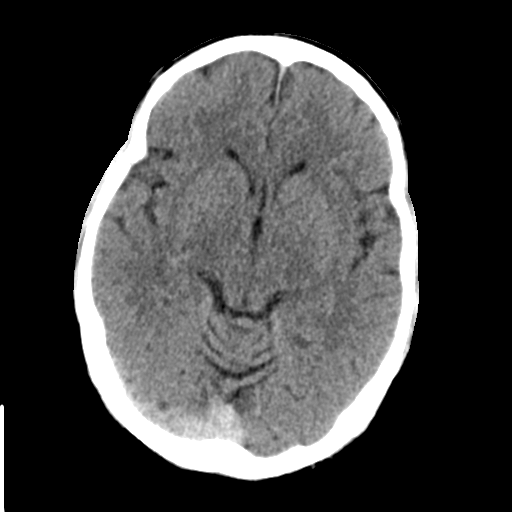
[im 16/31  brain]
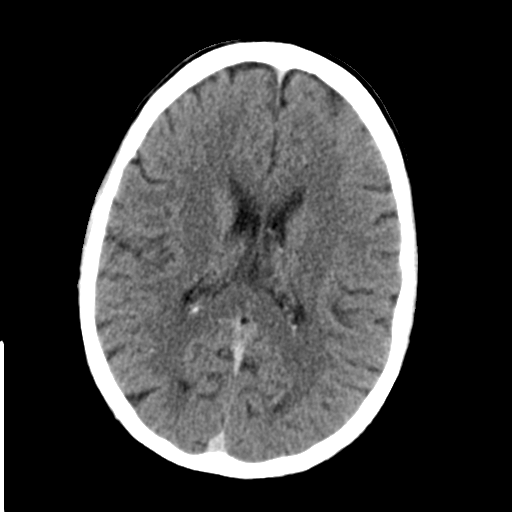
[im 16/31  bone]
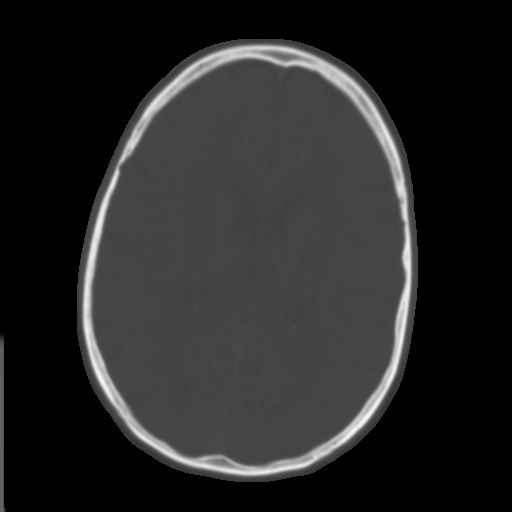
[im 19/31  brain]
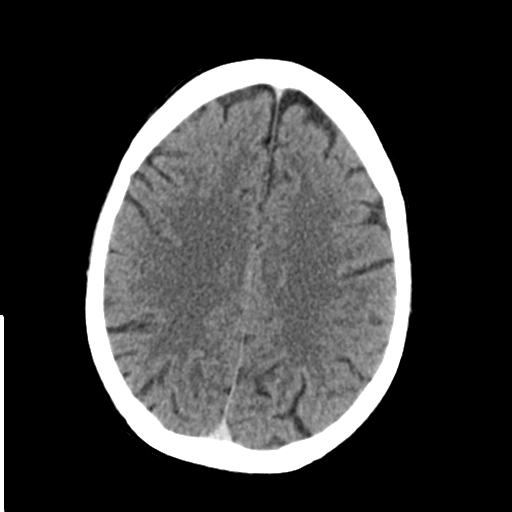
[im 22/31  brain]
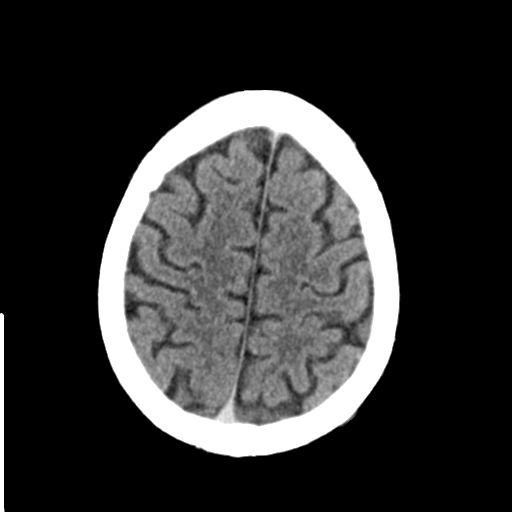
[im 25/31  brain]
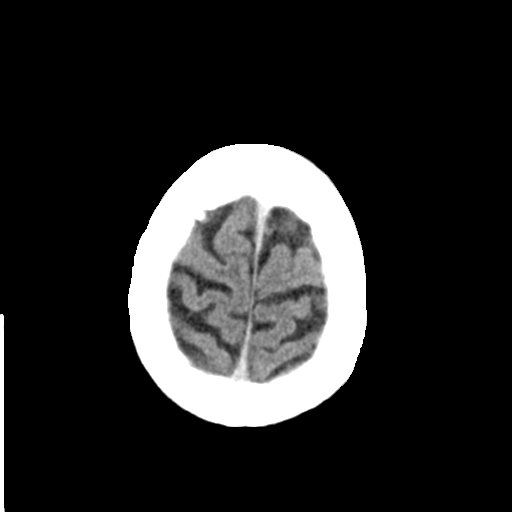
[im 28/31  brain]
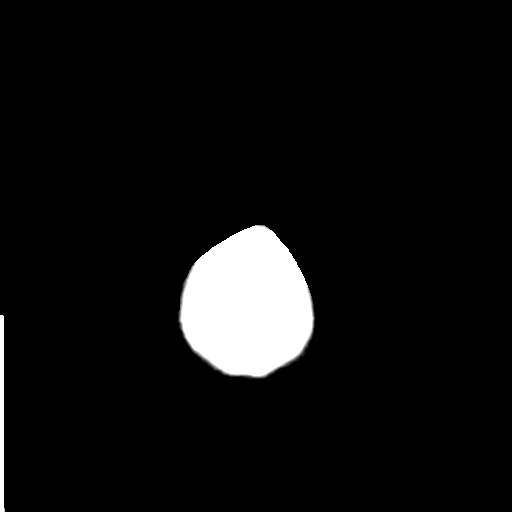
[im 28/31  bone]
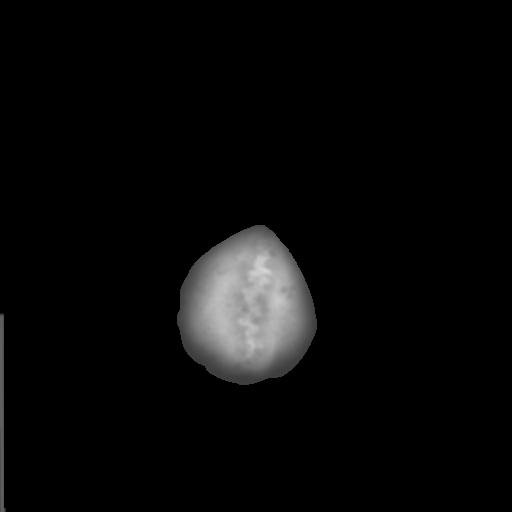

[Series 5: head 3.0 mpr cor · coronal · 0.30mm/px · 3 of 69 slices shown]
[im 23/69  brain]
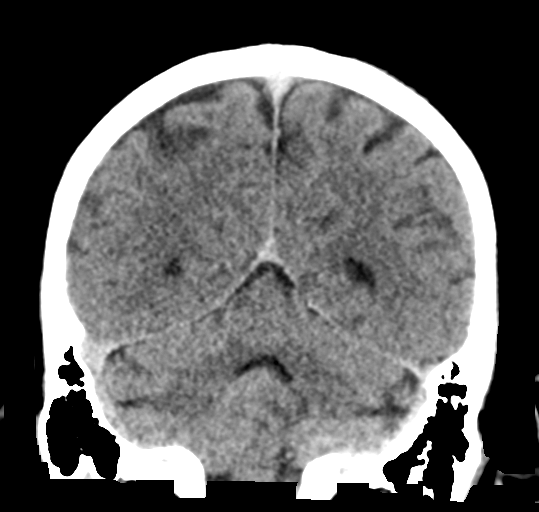
[im 31/69  brain]
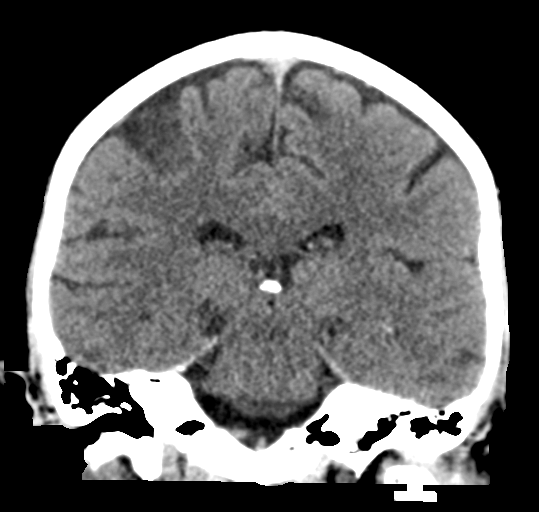
[im 38/69  brain]
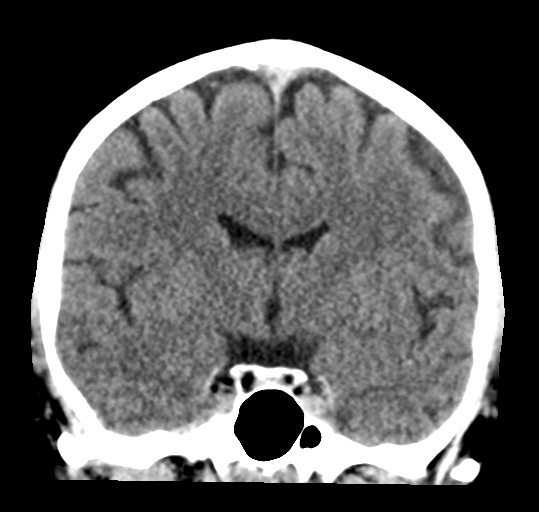

[Series 6: head 3.0 mpr sag · sagittal · 0.30mm/px · 3 of 67 slices shown]
[im 23/67  brain]
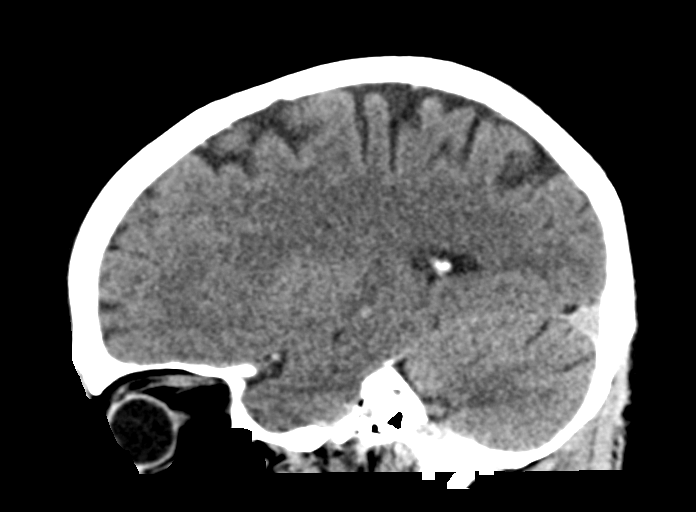
[im 34/67  brain]
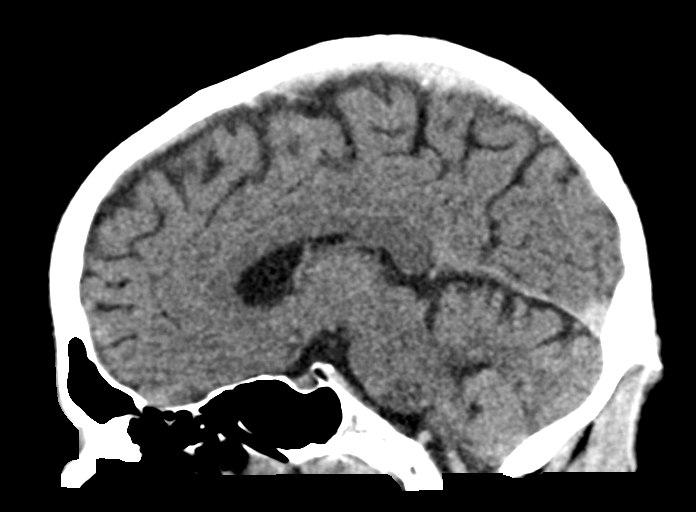
[im 45/67  brain]
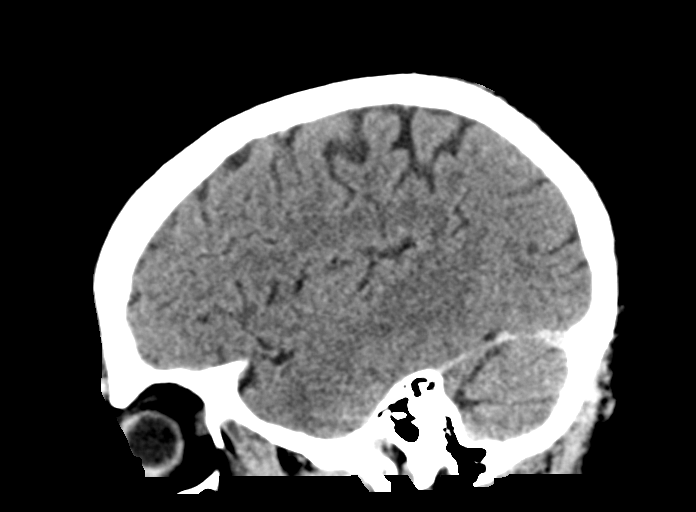

[15 of 47 positions shown; findings below may reference images not displayed]

FINDINGS: Brain: Normal cisterns and sulci at this time.

Gray-white matter differentiation is within normal limits throughout
the brain.

No midline shift, ventriculomegaly, mass effect, evidence of mass
lesion, intracranial hemorrhage or evidence of cortically based
acute infarction.

Vascular: Questionable trace intravascular contrast. No suspicious
intracranial vascular hyperdensity.

Skull: No acute osseous abnormality identified.

Sinuses/Orbits: Fluid in the visible pharynx. Visualized paranasal
sinuses and mastoids are well pneumatized.

Other: Visualized orbits and scalp soft tissues are within normal
limits.
IMPRESSION: Normal noncontrast CT appearance of the brain at this time.

## 2021-04-23 ENCOUNTER — Ambulatory Visit: Payer: Self-pay

## 2021-04-23 ENCOUNTER — Other Ambulatory Visit: Payer: Self-pay

## 2021-04-23 DIAGNOSIS — I5181 Takotsubo syndrome: Secondary | ICD-10-CM

## 2021-05-06 ENCOUNTER — Other Ambulatory Visit: Payer: Self-pay | Admitting: Student

## 2021-05-06 DIAGNOSIS — I10 Essential (primary) hypertension: Secondary | ICD-10-CM

## 2021-05-07 ENCOUNTER — Ambulatory Visit: Payer: Self-pay | Admitting: Cardiology

## 2021-05-15 ENCOUNTER — Other Ambulatory Visit (HOSPITAL_COMMUNITY)
Admission: RE | Admit: 2021-05-15 | Discharge: 2021-05-15 | Disposition: A | Discharge status: Home / Self Care | Payer: Self-pay | Source: Ambulatory Visit | Attending: Cardiology | Admitting: Cardiology

## 2021-05-15 DIAGNOSIS — I10 Essential (primary) hypertension: Secondary | ICD-10-CM | POA: Insufficient documentation

## 2021-05-15 LAB — CBC
HCT: 46.8 % (ref 39.0–52.0)
Hemoglobin: 15.5 g/dL (ref 13.0–17.0)
MCH: 29.5 pg (ref 26.0–34.0)
MCHC: 33.1 g/dL (ref 30.0–36.0)
MCV: 89 fL (ref 80.0–100.0)
Platelets: 240 10*3/uL (ref 150–400)
RBC: 5.26 MIL/uL (ref 4.22–5.81)
RDW: 13.6 % (ref 11.5–15.5)
WBC: 6.8 10*3/uL (ref 4.0–10.5)
nRBC: 0 % (ref 0.0–0.2)

## 2021-05-20 ENCOUNTER — Ambulatory Visit: Payer: Self-pay | Admitting: Cardiology

## 2021-05-20 ENCOUNTER — Encounter: Payer: Self-pay | Admitting: Cardiology

## 2021-05-20 ENCOUNTER — Other Ambulatory Visit: Payer: Self-pay

## 2021-05-20 VITALS — BP 111/73 | HR 69 | Temp 98.2°F | Resp 16 | Ht 68.0 in | Wt 144.6 lb

## 2021-05-20 DIAGNOSIS — E7800 Pure hypercholesterolemia, unspecified: Secondary | ICD-10-CM

## 2021-05-20 DIAGNOSIS — Z8674 Personal history of sudden cardiac arrest: Secondary | ICD-10-CM

## 2021-05-20 DIAGNOSIS — I10 Essential (primary) hypertension: Secondary | ICD-10-CM

## 2021-05-20 DIAGNOSIS — I428 Other cardiomyopathies: Secondary | ICD-10-CM

## 2021-05-20 DIAGNOSIS — F17200 Nicotine dependence, unspecified, uncomplicated: Secondary | ICD-10-CM

## 2021-05-20 MED ORDER — RED YEAST RICE EXTRACT 600 MG PO CAPS: 2.0000 | ORAL_CAPSULE | ORAL | 0 refills | Status: AC

## 2021-05-20 NOTE — Progress Notes (Signed)
Primary Physician:  Patient, No Pcp Per (Inactive)   Patient ID: Aaron Avery, male    DOB: 1966-05-24, 55 y.o.   MRN: [id number]   Chief Complaint  Patient presents with   Myopathy   Hypertension   Hyperlipidemia   Follow-up    6 months    HPI:   Aaron Avery  is a 55 y.o. Caucasian male admitted to [facility] on 05/10/2019 with VF arrest in the field, needing AED defibrillation, EF 25 to 30% by echocardiogram and normal coronary arteries, MRI revealing a focal scar in the obtuse marginal territory, mild hyperglycemia and has history of tobacco use disorder.  Coronary spasm versus ACS vs Stress cardiomyopathy felt to be the etiology.  No family history of sudden cardiac death or premature coronary artery disease.  He presents for 6 month visit.  He has been compliant by taking carvedilol and also losartan but does not want to be on statins.  He has no specific complaints today.  He is still smoking.  He had completely quit smoking in May 2022.  Past Medical History:  Diagnosis Date   Cardiomyopathy ([place])    Heart attack ([place]) 05/10/2019   Toothache     Past Surgical History:  Procedure Laterality Date   LEFT HEART CATH AND CORONARY ANGIOGRAPHY N/A 05/10/2019   Procedure: LEFT HEART CATH AND CORONARY ANGIOGRAPHY;  Surgeon: Nigel Mormon, MD;  Location: [facility];  Service: Cardiovascular;  Laterality: N/A;   None     Family History  Adopted: Yes   Social History   Tobacco Use   Smoking status: Former    Packs/day: 0.50    Types: Cigarettes    Quit date: 10/21/2020    Years since quitting: 0.5   Smokeless tobacco: Never  Substance Use Topics   Alcohol use: No   Marital Status: Divorced ROS:   Review of Systems  Cardiovascular:  Negative for chest pain, dyspnea on exertion and leg swelling.  Gastrointestinal:  Negative for melena.  Objective:  Blood pressure 111/73, pulse 69, temperature 98.2 F (36.8 C), temperature source  Temporal, resp. rate 16, height 5\' 8"  (1.727 m), weight 144 lb 9.6 oz (65.6 kg), SpO2 98 %. Body mass index is 21.99 kg/m.  Vitals with BMI 05/20/2021 11/01/2020 04/26/2020  Height 5\' 8"  5\' 8"  -  Weight 144 lbs 10 oz 150 lbs 3 oz -  BMI 123456 [id number] -  Systolic [id number]  Diastolic 73 66 87  Pulse 69 62 60    Physical Exam Neck:     Vascular: No carotid bruit or JVD.  Cardiovascular:     Rate and Rhythm: Normal rate and regular rhythm.     Pulses: Intact distal pulses.     Heart sounds: Normal heart sounds. No murmur heard.   No gallop.  Pulmonary:     Effort: Pulmonary effort is normal.     Breath sounds: Normal breath sounds.  Abdominal:     General: Bowel sounds are normal.     Palpations: Abdomen is soft.  Musculoskeletal:        General: No swelling.   Laboratory examination:   CMP Latest Ref Rng & Units 08/15/2019 05/14/2019 05/13/2019  Glucose 70 - 99 mg/dL 108(H) 92 67(L)  BUN 6 - 20 mg/dL 6 11 14   Creatinine 0.61 - 1.24 mg/dL 1.19 1.20 1.18  Sodium 135 - 145 mmol/L 140 141 142  Potassium 3.5 - 5.1 mmol/L 4.0 3.7 3.9  Chloride 98 - 111 mmol/L 102 109 108  CO2 22 - 32 mmol/L 28 22 21(L)  Calcium 8.9 - 10.3 mg/dL 9.6 0.2(R) 4.2(H)  Total Protein 6.5 - 8.1 g/dL 6.7 - 5.4(L)  Total Bilirubin 0.3 - 1.2 mg/dL 0.7 - 0.8  Alkaline Phos 38 - 126 U/L 64 - 78  AST 15 - 41 U/L 14(L) - 45(H)  ALT 0 - 44 U/L 15 - 30   CBC Latest Ref Rng & Units 05/15/2021 04/24/2020 05/14/2019  WBC 4.0 - 10.5 K/uL 6.8 6.5 12.8(H)  Hemoglobin 13.0 - 17.0 g/dL 06.2 37.6 11.6(L)  Hematocrit 39.0 - 52.0 % 46.8 49.3 35.4(L)  Platelets 150 - 400 K/uL 240 299 229   Lipid Panel     Component Value Date/Time   CHOL 193 04/24/2020 0825   TRIG 158 (H) 04/24/2020 0825   HDL 39 (L) 04/24/2020 0825   CHOLHDL 4.9 04/24/2020 0825   VLDL 32 04/24/2020 0825   LDLCALC 122 (H) 04/24/2020 0825   LDLDIRECT 129.7 (H) 10/31/2020 0856    Lipid Panel Recent Labs    10/31/20 0856  LDLDIRECT 129.7*     HEMOGLOBIN A1C Lab Results  Component Value Date   HGBA1C 6.0 (H) 05/10/2019   MPG 125.5 05/10/2019   TSH No results for input(s): TSH in the last 8760 hours.       Ref Range & Units 05/12/2020  TSH 0.350 - 4.500 uIU/mL 4.777 High        PRN Meds:. Medications Discontinued During This Encounter  Medication Reason   lovastatin (MEVACOR) 20 MG tablet Patient Preference    Current Outpatient Medications on File Prior to Visit  Medication Sig Dispense Refill   aspirin EC 81 MG tablet Take 1 tablet (81 mg total) by mouth daily. 180 tablet 3   losartan (COZAAR) 50 MG tablet Take 1 tablet (50 mg total) by mouth daily after supper. 90 tablet 3   carvedilol (COREG) 12.5 MG tablet Take 1 tablet (12.5 mg total) by mouth 2 (two) times daily. 180 tablet 3   No current facility-administered medications on file prior to visit.    Medication and allergies:   Allergies  Allergen Reactions   Atorvastatin Other (See Comments)    Abdominal discomfort, body aches and pain   Crestor [Rosuvastatin] Other (See Comments)    Myalgia    Outpatient Medications Prior to Visit  Medication Sig Dispense Refill   aspirin EC 81 MG tablet Take 1 tablet (81 mg total) by mouth daily. 180 tablet 3   losartan (COZAAR) 50 MG tablet Take 1 tablet (50 mg total) by mouth daily after supper. 90 tablet 3   carvedilol (COREG) 12.5 MG tablet Take 1 tablet (12.5 mg total) by mouth 2 (two) times daily. 180 tablet 3   lovastatin (MEVACOR) 20 MG tablet Take 1 tablet (20 mg total) by mouth at bedtime. (Patient not taking: Reported on 05/20/2021) 90 tablet 1   No facility-administered medications prior to visit.    Radiology:   No results found.  Cardiac Studies:    Left heart catheterization 05/11/2019:  Normal coronary arteries.  EF 35 to 40% with global hypokinesis.  Mild elevation in LVEDP at 20 mmHg.  Cardiac MRI 05/13/2019:  1. Subendocardial late gadolinium enhancement in the basal to mid anterolateral  and mid to apical inferolateral walls. This is consistent with a myocardial infarction, likely in an OM territory. 2. Normal LV size with normal global systolic function (EF 56%) with regional wall motion abnormalities.  Hypokinesis of basal anterolateral, mid anterolateral/inferolateral and apical lateral walls 3.  Normal RV size and systolic function (EF [id number]).  PCV ECHOCARDIOGRAM COMPLETE 04/23/2021  Narrative Echocardiogram 04/23/2021: Left ventricle cavity is normal in size and wall thickness. Normal global wall motion. Normal LV systolic function with EF 63%. Normal diastolic filling pattern. No significant valvular abnormality. Normal right atrial pressure. Previous study on 08/15/2019 reported LVEF 45-50%. Compared to 05/11/2019, EF improved from 25-30%   EKG  EKG 05/20/2021: Normal sinus rhythm at rate of 67 bpm, normal axis, no evidence of ischemia, normal EKG.  No significant change from 11/01/2020.  05/11/2019: Marked sinus bradycardia at rate of 49 bpm, prolonged QT interval.  Early repolarization.  Compared to 05/10/2019, sinus tachycardia at rate of 104 bpm with ST elevation in the inferior and lateral leads not present.  ST depression in anterior leads suggestive of posterior STEMI not present.   Assessment:     ICD-10-CM   1. Non-ischemic cardiomyopathy (HCC)  I42.8     2. History of cardiac arrest  Z86.74     3. Primary hypertension  I10 EKG 12-Lead    4. Hypercholesteremia  E78.00 Red Yeast Rice Extract 600 MG CAPS    Lipid Panel With LDL/HDL Ratio    Lipid Panel With LDL/HDL Ratio    5. Tobacco use disorder  F17.200      Meds ordered this encounter  Medications   Red Yeast Rice Extract 600 MG CAPS    Sig: Take 2 capsules (1,200 mg total) by mouth daily after supper.    Refill:  0      Recommendations:   Aaron Avery  is a 55 y.o. Caucasian male admitted to [facility] on 05/10/2019 with VF arrest in the field, needing AED defibrillation, EF 25 to  30% by echocardiogram and normal coronary arteries, MRI revealing a focal scar in the obtuse marginal territory, mild hyperglycemia and has history of tobacco use disorder.  Coronary spasm versus ACS vs Stress cardiomyopathy felt to be the etiology.  No family history of sudden cardiac death or premature coronary artery disease.   He presents for 6 month visit.  He has been compliant by taking carvedilol and also losartan but does not want to be on statins.  He has no specific complaints today.  He is still smoking.  He had completely quit smoking in May 2022.   Reviewed results of the echocardiogram, his LVEF is completely normalized.  His findings are most consistent with nonischemic cardiomyopathy as his LV systolic dysfunction was out of proportion to his scar noted on the MRI in the OM territory.  Again discussed with him regarding smoking cessation.  I reviewed the results of the lipid profile testing, patient does not want to be on statins.  He wants me to take the pravastatin out of his medication list which I have.  He is willing to try red yeast rice capsules.  I will repeat lipids in 1 year and see him back at that time.  He has chronic constipation, advised him to use Metamucil which may help with lowering cholesterol as well.    Adrian Prows, MD, [facility] 05/20/2021, 11:08 AM Office: [phone] Pager: [phone]

## 2021-05-27 ENCOUNTER — Ambulatory Visit: Payer: Self-pay | Admitting: Cardiology

## 2021-05-31 ENCOUNTER — Other Ambulatory Visit: Payer: Self-pay | Admitting: Cardiology

## 2021-05-31 DIAGNOSIS — I10 Essential (primary) hypertension: Secondary | ICD-10-CM

## 2021-05-31 DIAGNOSIS — I255 Ischemic cardiomyopathy: Secondary | ICD-10-CM

## 2021-08-29 ENCOUNTER — Other Ambulatory Visit: Payer: Self-pay | Admitting: Cardiology

## 2021-08-29 DIAGNOSIS — I255 Ischemic cardiomyopathy: Secondary | ICD-10-CM

## 2021-08-29 DIAGNOSIS — I10 Essential (primary) hypertension: Secondary | ICD-10-CM

## 2021-12-06 ENCOUNTER — Other Ambulatory Visit: Payer: Self-pay | Admitting: Cardiology

## 2021-12-06 DIAGNOSIS — I10 Essential (primary) hypertension: Secondary | ICD-10-CM

## 2021-12-06 DIAGNOSIS — I255 Ischemic cardiomyopathy: Secondary | ICD-10-CM

## 2022-05-21 ENCOUNTER — Ambulatory Visit: Payer: Self-pay | Admitting: Cardiology

## 2022-07-07 ENCOUNTER — Ambulatory Visit: Payer: Self-pay | Admitting: Cardiology
